# Patient Record
Sex: Female | Born: 1945 | Race: White | Hispanic: No | Marital: Married | Smoking: Never smoker
Health system: Southern US, Community
[De-identification: ages and names within clinical notes are randomized; demographics above are authoritative.]

## PROBLEM LIST (undated history)

## (undated) DIAGNOSIS — E119 Type 2 diabetes mellitus without complications: Secondary | ICD-10-CM

## (undated) DIAGNOSIS — N183 Chronic kidney disease, stage 3 unspecified: Secondary | ICD-10-CM

## (undated) DIAGNOSIS — E7800 Pure hypercholesterolemia, unspecified: Secondary | ICD-10-CM

## (undated) DIAGNOSIS — M1990 Unspecified osteoarthritis, unspecified site: Secondary | ICD-10-CM

## (undated) DIAGNOSIS — I10 Essential (primary) hypertension: Secondary | ICD-10-CM

## (undated) DIAGNOSIS — E1140 Type 2 diabetes mellitus with diabetic neuropathy, unspecified: Secondary | ICD-10-CM

## (undated) HISTORY — PX: CHOLECYSTECTOMY: SHX55

## (undated) HISTORY — PX: BACK SURGERY: SHX140

---

## 2005-01-30 ENCOUNTER — Ambulatory Visit: Payer: Self-pay | Admitting: Internal Medicine

## 2005-06-15 ENCOUNTER — Ambulatory Visit: Payer: Self-pay | Admitting: Unknown Physician Specialty

## 2006-02-04 ENCOUNTER — Ambulatory Visit: Payer: Self-pay | Admitting: Internal Medicine

## 2006-07-12 ENCOUNTER — Ambulatory Visit: Payer: Self-pay | Admitting: Internal Medicine

## 2006-08-10 ENCOUNTER — Ambulatory Visit: Payer: Self-pay | Admitting: Internal Medicine

## 2007-02-09 ENCOUNTER — Ambulatory Visit: Payer: Self-pay | Admitting: Internal Medicine

## 2007-12-29 ENCOUNTER — Ambulatory Visit: Payer: Self-pay | Admitting: Internal Medicine

## 2008-02-13 ENCOUNTER — Ambulatory Visit: Payer: Self-pay | Admitting: Internal Medicine

## 2008-07-03 ENCOUNTER — Ambulatory Visit: Payer: Self-pay | Admitting: Internal Medicine

## 2009-02-19 ENCOUNTER — Ambulatory Visit: Payer: Self-pay | Admitting: Internal Medicine

## 2010-03-04 ENCOUNTER — Ambulatory Visit: Payer: Self-pay | Admitting: Internal Medicine

## 2011-03-06 ENCOUNTER — Ambulatory Visit: Payer: Self-pay | Admitting: Internal Medicine

## 2012-03-07 ENCOUNTER — Ambulatory Visit: Payer: Self-pay | Admitting: Internal Medicine

## 2013-03-08 ENCOUNTER — Ambulatory Visit: Payer: Self-pay | Admitting: Internal Medicine

## 2013-04-20 ENCOUNTER — Ambulatory Visit: Payer: Self-pay | Admitting: Unknown Physician Specialty

## 2013-04-21 LAB — PATHOLOGY REPORT

## 2014-03-13 ENCOUNTER — Ambulatory Visit: Payer: Self-pay | Admitting: Internal Medicine

## 2014-12-05 ENCOUNTER — Ambulatory Visit
Admit: 2014-12-05 | Disposition: A | Discharge status: Home / Self Care | Payer: Self-pay | Attending: Ophthalmology | Admitting: Ophthalmology

## 2014-12-05 LAB — POTASSIUM: Potassium: 4.4 mmol/L

## 2014-12-10 ENCOUNTER — Encounter: Payer: Self-pay | Admitting: *Deleted

## 2014-12-16 NOTE — H&P (Signed)
  History and physical was faxed and scanned in.   

## 2014-12-17 ENCOUNTER — Ambulatory Visit: Payer: Medicare Other | Admitting: Anesthesiology

## 2014-12-17 ENCOUNTER — Encounter: Payer: Self-pay | Admitting: Anesthesiology

## 2014-12-17 ENCOUNTER — Ambulatory Visit
Admission: RE | Admit: 2014-12-17 | Discharge: 2014-12-17 | Disposition: A | Discharge status: Home / Self Care | Payer: Medicare Other | Source: Ambulatory Visit | Attending: Ophthalmology | Admitting: Ophthalmology

## 2014-12-17 ENCOUNTER — Encounter
Admission: RE | Disposition: A | Discharge status: Home / Self Care | Payer: Self-pay | Source: Ambulatory Visit | Attending: Ophthalmology

## 2014-12-17 DIAGNOSIS — E118 Type 2 diabetes mellitus with unspecified complications: Secondary | ICD-10-CM | POA: Diagnosis not present

## 2014-12-17 DIAGNOSIS — I10 Essential (primary) hypertension: Secondary | ICD-10-CM | POA: Insufficient documentation

## 2014-12-17 DIAGNOSIS — H2511 Age-related nuclear cataract, right eye: Secondary | ICD-10-CM | POA: Insufficient documentation

## 2014-12-17 DIAGNOSIS — Z888 Allergy status to other drugs, medicaments and biological substances status: Secondary | ICD-10-CM | POA: Insufficient documentation

## 2014-12-17 DIAGNOSIS — H269 Unspecified cataract: Secondary | ICD-10-CM | POA: Diagnosis present

## 2014-12-17 HISTORY — PX: CATARACT EXTRACTION W/PHACO: SHX586

## 2014-12-17 HISTORY — DX: Essential (primary) hypertension: I10

## 2014-12-17 HISTORY — DX: Unspecified osteoarthritis, unspecified site: M19.90

## 2014-12-17 HISTORY — DX: Type 2 diabetes mellitus with diabetic neuropathy, unspecified: E11.40

## 2014-12-17 LAB — GLUCOSE, CAPILLARY: Glucose-Capillary: 170 mg/dL — ABNORMAL HIGH (ref 70–99)

## 2014-12-17 SURGERY — PHACOEMULSIFICATION, CATARACT, WITH IOL INSERTION
Anesthesia: Monitor Anesthesia Care | Site: Eye | Laterality: Right | Wound class: Clean

## 2014-12-17 MED ORDER — HYALURONIDASE HUMAN 150 UNIT/ML IJ SOLN
INTRAMUSCULAR | Status: AC
  Filled 2014-12-17: qty 1

## 2014-12-17 MED ORDER — MOXIFLOXACIN HCL 0.5 % OP SOLN - NO CHARGE
OPHTHALMIC | Status: DC | PRN
  Administered 2014-12-17: 1 [drp] via OPHTHALMIC

## 2014-12-17 MED ORDER — MOXIFLOXACIN HCL 0.5 % OP SOLN
OPHTHALMIC | Status: AC
  Administered 2014-12-17: 1 [drp] via OPHTHALMIC
  Filled 2014-12-17: qty 3

## 2014-12-17 MED ORDER — CYCLOPENTOLATE HCL 2 % OP SOLN
OPHTHALMIC | Status: AC
  Filled 2014-12-17: qty 2

## 2014-12-17 MED ORDER — EPINEPHRINE HCL 1 MG/ML IJ SOLN
INTRAMUSCULAR | Status: AC
  Filled 2014-12-17: qty 1

## 2014-12-17 MED ORDER — MOXIFLOXACIN HCL 0.5 % OP SOLN
1.0000 [drp] | OPHTHALMIC | Status: AC
Start: 2014-12-17 — End: 2014-12-17
  Administered 2014-12-17 (×3): 1 [drp] via OPHTHALMIC

## 2014-12-17 MED ORDER — TETRACAINE HCL 0.5 % OP SOLN
OPHTHALMIC | Status: DC | PRN
  Administered 2014-12-17: 1 [drp] via OPHTHALMIC

## 2014-12-17 MED ORDER — PHENYLEPHRINE HCL 10 % OP SOLN
OPHTHALMIC | Status: AC
  Filled 2014-12-17: qty 5

## 2014-12-17 MED ORDER — BSS IO SOLN
INTRAOCULAR | Status: DC | PRN
  Administered 2014-12-17: 1 mL

## 2014-12-17 MED ORDER — NA CHONDROIT SULF-NA HYALURON 40-17 MG/ML IO SOLN
INTRAOCULAR | Status: DC | PRN
  Administered 2014-12-17: 1 mL via INTRAOCULAR

## 2014-12-17 MED ORDER — ALFENTANIL 500 MCG/ML IJ INJ
INJECTION | INTRAMUSCULAR | Status: DC | PRN
  Administered 2014-12-17: 500 ug via INTRAVENOUS

## 2014-12-17 MED ORDER — MIDAZOLAM HCL 2 MG/2ML IJ SOLN
INTRAMUSCULAR | Status: DC | PRN
  Administered 2014-12-17 (×2): 0.5 mg via INTRAVENOUS

## 2014-12-17 MED ORDER — LIDOCAINE HCL (PF) 4 % IJ SOLN
INTRAMUSCULAR | Status: AC
  Filled 2014-12-17: qty 5

## 2014-12-17 MED ORDER — PHENYLEPHRINE HCL 10 % OP SOLN
1.0000 [drp] | OPHTHALMIC | Status: AC
  Administered 2014-12-17 (×2): 1 [drp] via OPHTHALMIC
  Administered 2014-12-17: 09:00:00 via OPHTHALMIC
  Administered 2014-12-17: 1 [drp] via OPHTHALMIC

## 2014-12-17 MED ORDER — CEFUROXIME OPHTHALMIC INJECTION 1 MG/0.1 ML
INJECTION | OPHTHALMIC | Status: AC
  Filled 2014-12-17: qty 0.1

## 2014-12-17 MED ORDER — BUPIVACAINE HCL (PF) 0.75 % IJ SOLN
INTRAMUSCULAR | Status: AC
  Filled 2014-12-17: qty 10

## 2014-12-17 MED ORDER — NA CHONDROIT SULF-NA HYALURON 40-17 MG/ML IO SOLN
INTRAOCULAR | Status: AC
  Filled 2014-12-17: qty 1

## 2014-12-17 MED ORDER — SODIUM CHLORIDE 0.9 % IV SOLN
INTRAVENOUS | Status: DC
  Administered 2014-12-17: 09:00:00 via INTRAVENOUS

## 2014-12-17 MED ORDER — TETRACAINE HCL 0.5 % OP SOLN
OPHTHALMIC | Status: AC
  Filled 2014-12-17: qty 2

## 2014-12-17 MED ORDER — CYCLOPENTOLATE HCL 2 % OP SOLN
1.0000 [drp] | OPHTHALMIC | Status: AC
  Administered 2014-12-17: 1 [drp] via OPHTHALMIC
  Administered 2014-12-17: 08:00:00 via OPHTHALMIC
  Administered 2014-12-17 (×2): 1 [drp] via OPHTHALMIC

## 2014-12-17 MED ORDER — CARBACHOL 0.01 % IO SOLN
INTRAOCULAR | Status: DC | PRN
  Administered 2014-12-17: 0.5 mL via INTRAOCULAR

## 2014-12-17 SURGICAL SUPPLY — 27 items
CENTURION VISION SYSTEM ×2 IMPLANT
CORD BIP STRL DISP 12FT (MISCELLANEOUS) ×2 IMPLANT
DRAPE XRAY CASSETTE 23X24 (DRAPES) ×2 IMPLANT
ERASER HMR WETFIELD 18G (MISCELLANEOUS) ×2 IMPLANT
GLOVE BIO SURGEON STRL SZ8 (GLOVE) ×2 IMPLANT
GLOVE SURG LX 6.5 MICRO (GLOVE) ×1
GLOVE SURG LX 8.0 MICRO (GLOVE) ×1
GLOVE SURG LX STRL 6.5 MICRO (GLOVE) ×1 IMPLANT
GLOVE SURG LX STRL 8.0 MICRO (GLOVE) ×1 IMPLANT
GOWN STRL REUS W/ TWL LRG LVL3 (GOWN DISPOSABLE) ×1 IMPLANT
GOWN STRL REUS W/ TWL XL LVL3 (GOWN DISPOSABLE) ×1 IMPLANT
GOWN STRL REUS W/TWL LRG LVL3 (GOWN DISPOSABLE) ×1
GOWN STRL REUS W/TWL XL LVL3 (GOWN DISPOSABLE) ×1
LENS IOL ACRSF IQ ULTRA 16.0 (Intraocular Lens) ×1 IMPLANT
LENS IOL ACRYSERT 16.0 (Intraocular Lens) ×2 IMPLANT
LENS IOL ACRYSOF IQ 16.0 (Intraocular Lens) ×2 IMPLANT
PACK CATARACT (MISCELLANEOUS) ×2 IMPLANT
PACK CATARACT DINGLEDEIN LX (MISCELLANEOUS) ×2 IMPLANT
PACK EYE AFTER SURG (MISCELLANEOUS) ×2 IMPLANT
SHLD EYE VISITEC  UNIV (MISCELLANEOUS) ×2 IMPLANT
SOL PREP PVP 2OZ (MISCELLANEOUS) ×2
SOLUTION PREP PVP 2OZ (MISCELLANEOUS) ×1 IMPLANT
SUT SILK 5-0 (SUTURE) ×2 IMPLANT
SYR 5ML LL (SYRINGE) ×2 IMPLANT
SYR TB 1ML 27GX1/2 LL (SYRINGE) ×2 IMPLANT
WATER STERILE IRR 1000ML POUR (IV SOLUTION) ×2 IMPLANT
WIPE NON LINTING 3.25X3.25 (MISCELLANEOUS) ×2 IMPLANT

## 2014-12-17 NOTE — Discharge Instructions (Addendum)
Appointment Time 10:25   12/18/14  See handout. Eye Surgery Discharge Instructions  Expect mild scratchy sensation or mild soreness. DO NOT RUB YOUR EYE!  The day of surgery:  Minimal physical activity, but bed rest is not required  No reading, computer work, or close hand work  No bending, lifting, or straining.  May watch TV  For 24 hours:  No driving, legal decisions, or alcoholic beverages  Safety precautions  Eat anything you prefer: It is better to start with liquids, then soup then solid foods.  _____ Eye patch should be worn until postoperative exam tomorrow.  ____ Solar shield eyeglasses should be worn for comfort in the sunlight/patch while sleeping  Resume all regular medications including aspirin or Coumadin if these were discontinued prior to surgery. You may shower, bathe, shave, or wash your hair. Tylenol may be taken for mild discomfort.  Call your doctor if you experience significant pain, nausea, or vomiting, fever > 101 or other signs of infection. [phone] or [phone] Specific instructions:  Follow-up Information    Follow up with DINGELDEIN,STEVEN, MD In 1 day.   Specialty:  Ophthalmology   Contact information:   [address]   [place] [id number] [phone]

## 2014-12-17 NOTE — Transfer of Care (Signed)
Immediate Anesthesia Transfer of Care Note  Patient: Kara Wood  Procedure(s) Performed: Procedure(s) with comments: CATARACT EXTRACTION PHACO AND INTRAOCULAR LENS PLACEMENT (IOC) (Right) - US 01:17 AP% 26.0 CDE 33.35  Patient Location: PACU  Anesthesia Type:MAC  Level of Consciousness: awake, alert  and oriented  Airway & Oxygen Therapy: Patient Spontanous Breathing  Post-op Assessment: Report given to RN and Post -op Vital signs reviewed and stable  Post vital signs: Reviewed and stable  Last Vitals:  Filed Vitals:   12/17/14 0800  BP: 141/56  Pulse: 60  Temp: 37.2 C  Resp: 20    Complications: No apparent anesthesia complications

## 2014-12-17 NOTE — Interval H&P Note (Signed)
History and Physical Interval Note:  12/17/2014 10:20 AM  Kara Wood  has presented today for surgery, with the diagnosis of cataract  The various methods of treatment have been discussed with the patient and family. After consideration of risks, benefits and other options for treatment, the patient has consented to  Procedure(s): CATARACT EXTRACTION PHACO AND INTRAOCULAR LENS PLACEMENT (IOC) (Right) as a surgical intervention .  The patient's history has been reviewed, patient examined, no change in status, stable for surgery.  I have reviewed the patient's chart and labs.  Questions were answered to the patient's satisfaction.     Acy Orsak

## 2014-12-17 NOTE — Anesthesia Preprocedure Evaluation (Signed)
Anesthesia Evaluation   Patient awake    Reviewed: Allergy & Precautions, H&P , NPO status , Patient's Chart, lab work & pertinent test results  Airway Mallampati: II  TM Distance: >3 FB Neck ROM: full    Dental  (+) Chipped   Pulmonary          Cardiovascular hypertension,     Neuro/Psych    GI/Hepatic   Endo/Other  diabetes, Well Controlled, Type 2  Renal/GU      Musculoskeletal   Abdominal   Peds  Hematology   Anesthesia Other Findings   Reproductive/Obstetrics                             Anesthesia Physical Anesthesia Plan  ASA: III  Anesthesia Plan: MAC   Post-op Pain Management:    Induction:   Airway Management Planned: Nasal Cannula  Additional Equipment:   Intra-op Plan:   Post-operative Plan:   Informed Consent: I have reviewed the patients History and Physical, chart, labs and discussed the procedure including the risks, benefits and alternatives for the proposed anesthesia with the patient or authorized representative who has indicated his/her understanding and acceptance.     Plan Discussed with: CRNA  Anesthesia Plan Comments:         Anesthesia Quick Evaluation

## 2014-12-17 NOTE — Anesthesia Postprocedure Evaluation (Signed)
  Anesthesia Post-op Note  Patient: Kara Wood  Procedure(s) Performed: Procedure(s) with comments: CATARACT EXTRACTION PHACO AND INTRAOCULAR LENS PLACEMENT (IOC) (Right) - US 01:17 AP% 26.0 CDE 33.35  Anesthesia type:MAC  Patient location: PACU  Post pain: Pain level controlled  Post assessment: Post-op Vital signs reviewed, Patient's Cardiovascular Status Stable, Respiratory Function Stable, Patent Airway and No signs of Nausea or vomiting  Post vital signs: Reviewed and stable  Last Vitals:  Filed Vitals:   12/17/14 0800  BP: 141/56  Pulse: 60  Temp: 37.2 C  Resp: 20    Level of consciousness: awake, alert  and patient cooperative  Complications: No apparent anesthesia complications

## 2014-12-17 NOTE — Op Note (Signed)
Date of Surgery: 12/17/2014 Date of Dictation: 12/17/2014 Pre-operative Diagnosis:Nuclear Sclerotic Cataract, Posterior Subcapsular Cataract, Cortical Cataract and Mature Cataract right Eye Post-operative Diagnosis: same Procedure performed: Extra-capsular Cataract Extraction (ECCE) with placement of a posterior chamber intraocular lens (IOL) right Eye IOL: SN^CWS 16.0D  Anesthesia: 2% Lidocaine and 4% Marcaine in a 50/50 mixture with 10 unites/ml of Hylenex given as a peribulbar Anesthesiologist: Dr. Maisie Fushomas Complications: none Estimated Blood Loss: less than 1 ml  Description of procedure:  The patient was given anesthesia and sedation via intravenous access. The patient was then prepped and draped in the usual fashion. A 25-gauge needle was bent for initiating the capsulorhexis. A 5-0 silk suture was placed through the conjunctiva superior and inferiorly to serve as bridle sutures. Hemostasis was obtained at the superior limbus using an eraser cautery. A partial thickness groove was made at the anterior surgical limbus with a 64 Beaver blade and this was dissected anteriorly with an SYSCOlcon Crescent knife. The anterior chamber was entered at 10 o'clock with a 1.0 mm paracentesis knife and through the lamellar dissection with a 2.6 mm Alcon keratome. DiscoVisc was injected to replace the aqueous and a continuous tear curvilinear capsulorhexis was performed using a bent 25-gauge needle.  Balance salt on a syringe was used to perform hydro-dissection and phacoemulsification was carried out using a divide and conquer technique. Total ultrasound time was 1:17. The average ultrasonic power was 26.0. The CDE was 33.35.  Irrigation/aspiration was used to remove the residual cortex and the capsular bag was inflated with DiscoVisc. The intraocular lens was inserted into the capsular bag using a pre-loaded Acrysert Delivery System. Irrigation/aspiration was used to remove the residual DiscoVisc. The wound was  inflated with balanced salt and checked for leaks. None were found. Miostat was injected via the paracentesis track and 0.1 ml of cefuroxime containing 1 mg of drug  was injected via the paracentesis track. The wound was checked for leaks again and none were found.   The bridal sutures were removed and two drops of Vigamox were placed on the eye. An eye shield was placed to protect the eye and the patient was discharged to the recovery area in good condition.   Kissy Cielo MD

## 2014-12-20 ENCOUNTER — Encounter: Payer: Self-pay | Admitting: Ophthalmology

## 2015-01-15 ENCOUNTER — Other Ambulatory Visit: Payer: Self-pay | Admitting: Internal Medicine

## 2015-01-15 DIAGNOSIS — Z1231 Encounter for screening mammogram for malignant neoplasm of breast: Secondary | ICD-10-CM

## 2015-03-15 ENCOUNTER — Other Ambulatory Visit: Payer: Self-pay | Admitting: Internal Medicine

## 2015-03-15 ENCOUNTER — Ambulatory Visit
Admission: RE | Admit: 2015-03-15 | Discharge: 2015-03-15 | Disposition: A | Discharge status: Home / Self Care | Payer: Medicare Other | Source: Ambulatory Visit | Attending: Internal Medicine | Admitting: Internal Medicine

## 2015-03-15 DIAGNOSIS — Z1231 Encounter for screening mammogram for malignant neoplasm of breast: Secondary | ICD-10-CM

## 2016-01-23 ENCOUNTER — Other Ambulatory Visit: Payer: Self-pay | Admitting: Internal Medicine

## 2016-01-23 DIAGNOSIS — Z1231 Encounter for screening mammogram for malignant neoplasm of breast: Secondary | ICD-10-CM

## 2016-03-18 ENCOUNTER — Ambulatory Visit
Admission: RE | Admit: 2016-03-18 | Discharge: 2016-03-18 | Disposition: A | Discharge status: Home / Self Care | Payer: Medicare Other | Source: Ambulatory Visit | Attending: Internal Medicine | Admitting: Internal Medicine

## 2016-03-18 ENCOUNTER — Other Ambulatory Visit: Payer: Self-pay | Admitting: Internal Medicine

## 2016-03-18 DIAGNOSIS — Z1231 Encounter for screening mammogram for malignant neoplasm of breast: Secondary | ICD-10-CM | POA: Insufficient documentation

## 2016-04-28 IMAGING — MG MM DIGITAL SCREENING BILAT W/ TOMO W/ CAD
8 of 12 series · 8 of 28 positions shown · non-contrast
Comparison: Previous exam(s).

CLINICAL DATA: Screening.

EXAM:
DIGITAL SCREENING BILATERAL MAMMOGRAM WITH 3D TOMO WITH CAD

[R CC synth-2D]
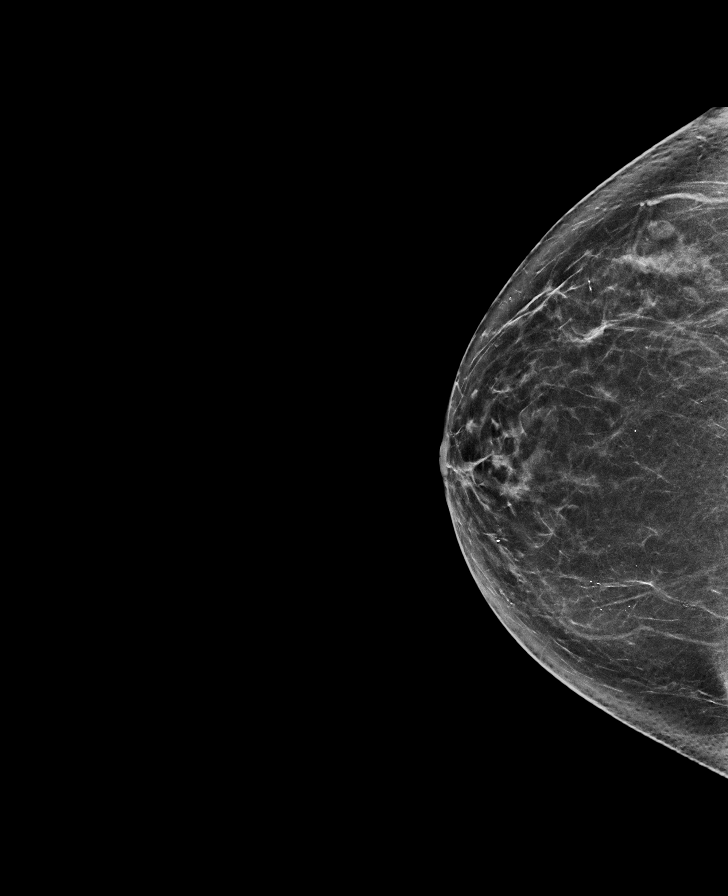

[R CC]
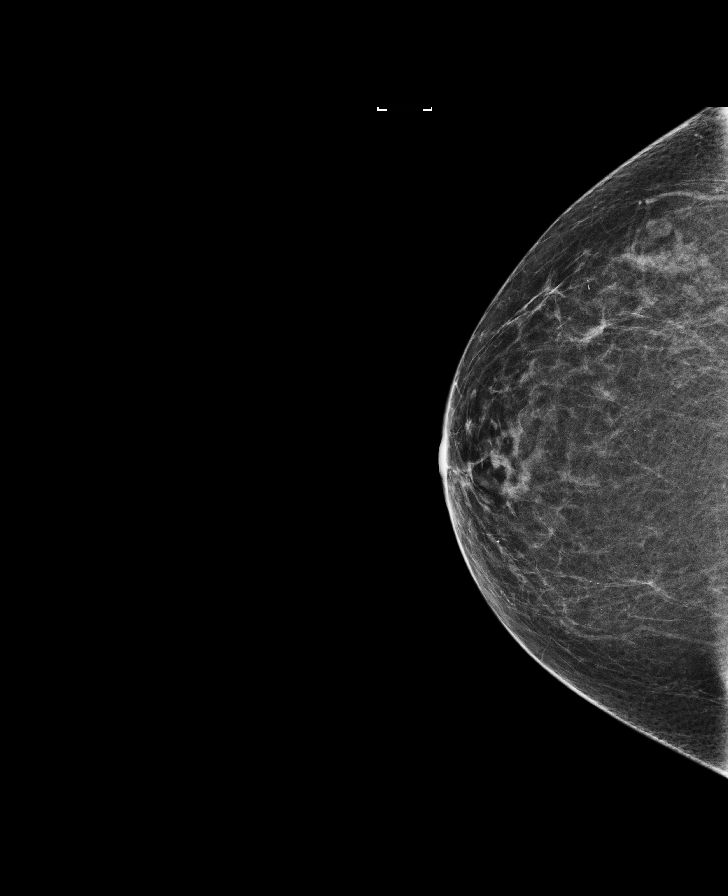

[R MLO synth-2D]
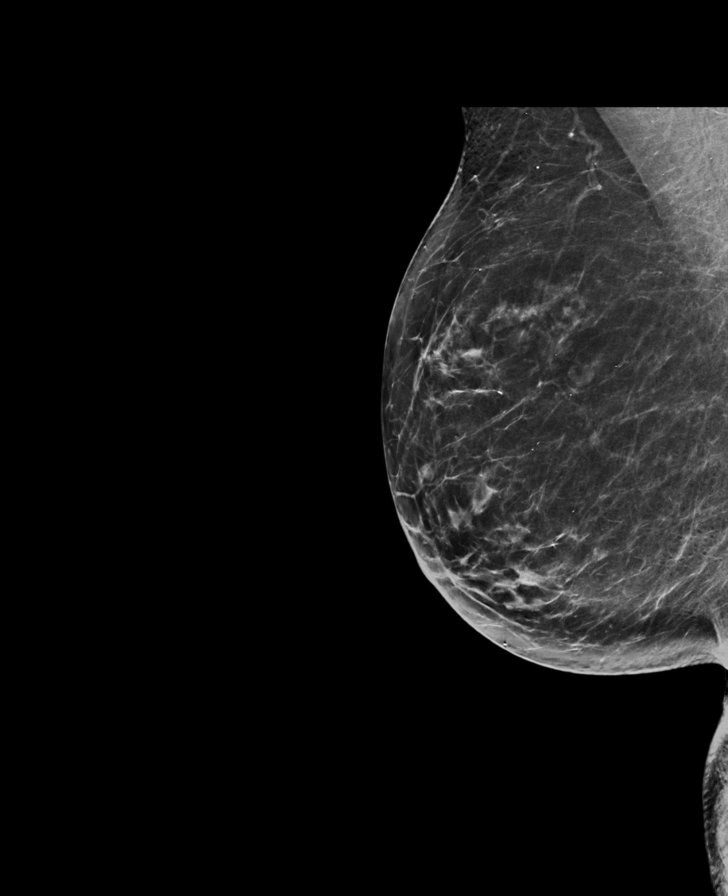

[L MLO synth-2D]
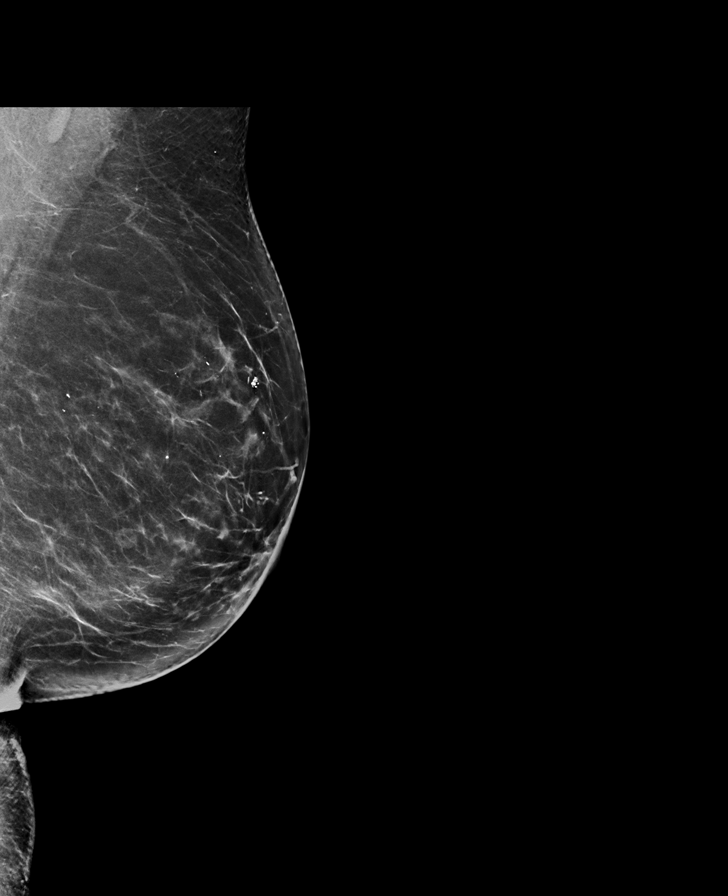

[L CC]
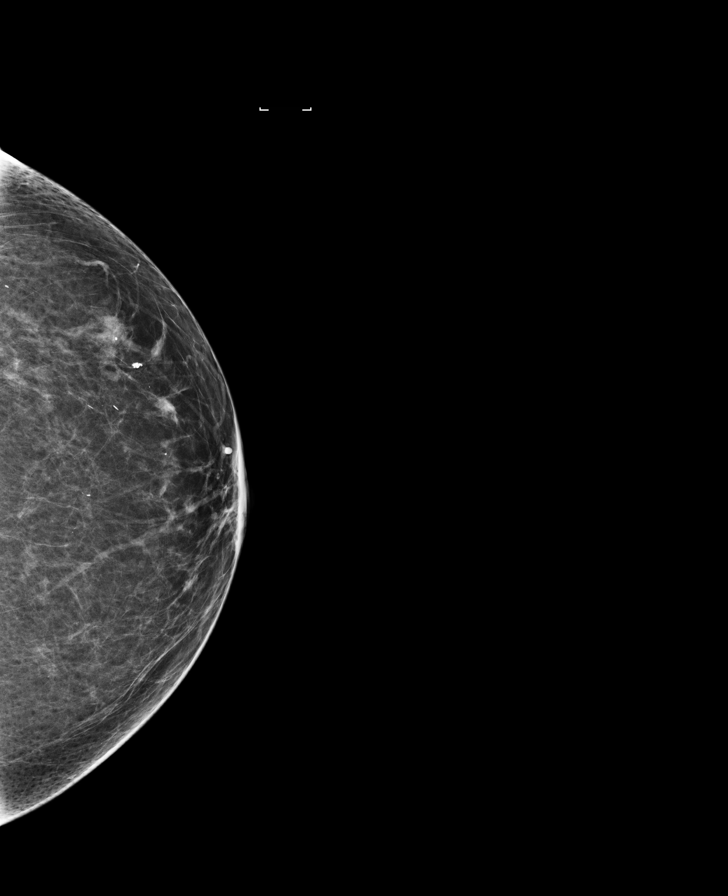

[R MLO]
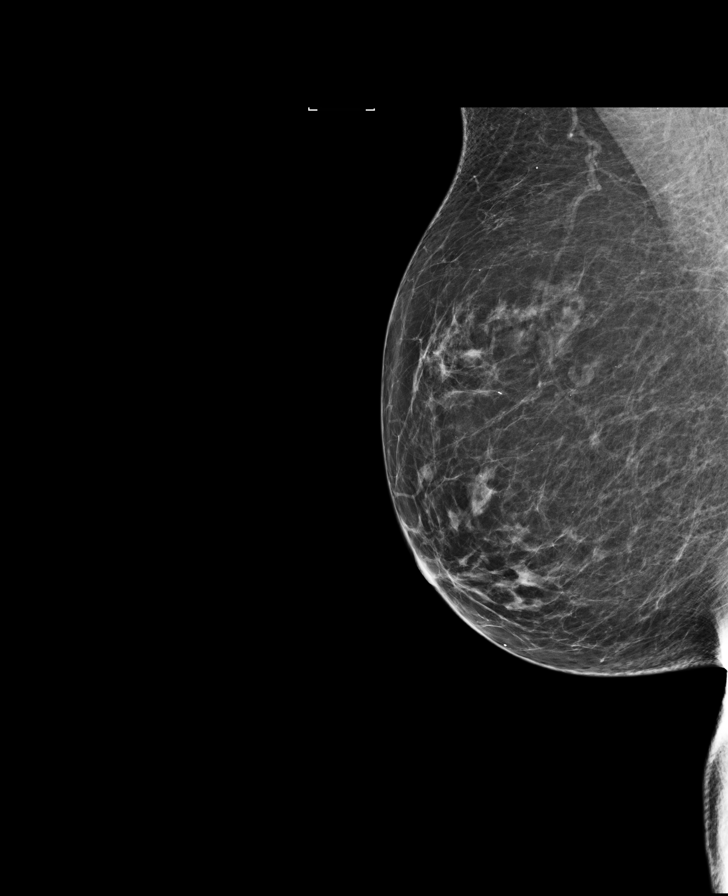

[L MLO]
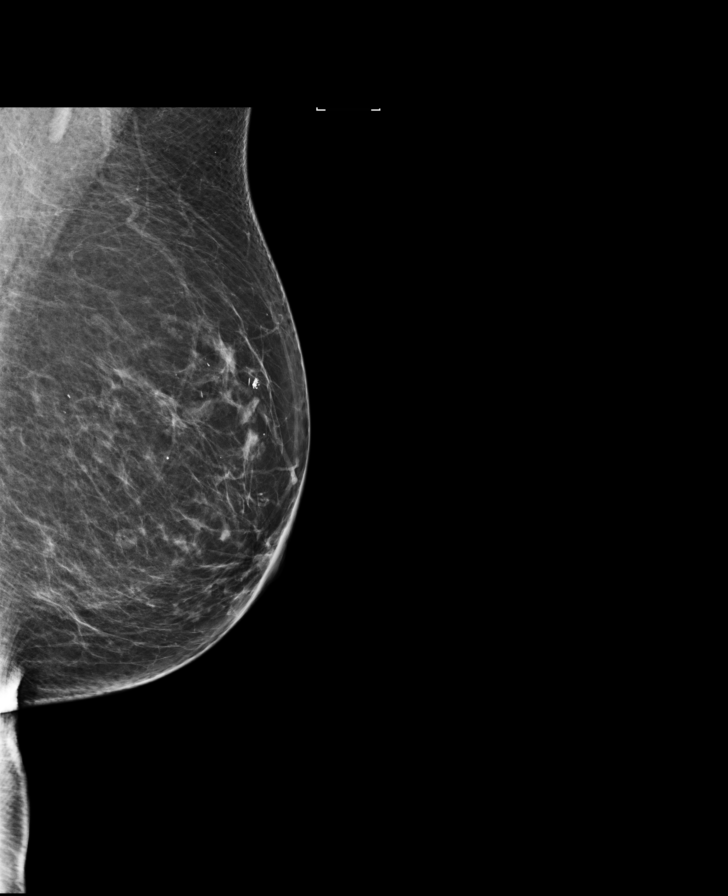

[L CC synth-2D]
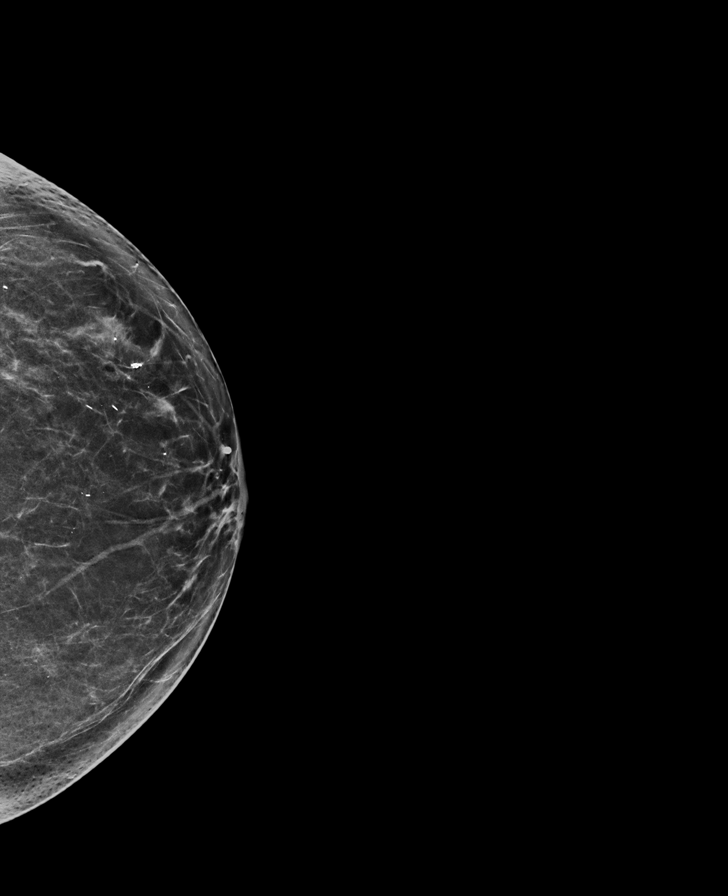

[8 of 28 positions shown; findings below may reference images not displayed]

ACR Breast Density Category b: There are scattered areas of
fibroglandular density.
FINDINGS: There are no findings suspicious for malignancy. Images were
processed with CAD.
IMPRESSION: No mammographic evidence of malignancy. A result letter of this
screening mammogram will be mailed directly to the patient.

RECOMMENDATION:
Screening mammogram in one year. (Code:55-L-23V)

BI-RADS CATEGORY  1: Negative.

## 2017-01-20 ENCOUNTER — Other Ambulatory Visit: Payer: Self-pay | Admitting: Internal Medicine

## 2017-01-20 DIAGNOSIS — Z1231 Encounter for screening mammogram for malignant neoplasm of breast: Secondary | ICD-10-CM

## 2017-03-23 ENCOUNTER — Ambulatory Visit
Admission: RE | Admit: 2017-03-23 | Discharge: 2017-03-23 | Disposition: A | Discharge status: Home / Self Care | Payer: Medicare Other | Source: Ambulatory Visit | Attending: Internal Medicine | Admitting: Internal Medicine

## 2017-03-23 DIAGNOSIS — Z1231 Encounter for screening mammogram for malignant neoplasm of breast: Secondary | ICD-10-CM | POA: Insufficient documentation

## 2018-02-21 ENCOUNTER — Other Ambulatory Visit: Payer: Self-pay | Admitting: Internal Medicine

## 2018-02-21 DIAGNOSIS — Z1231 Encounter for screening mammogram for malignant neoplasm of breast: Secondary | ICD-10-CM

## 2018-03-24 ENCOUNTER — Ambulatory Visit
Admission: RE | Admit: 2018-03-24 | Discharge: 2018-03-24 | Disposition: A | Discharge status: Home / Self Care | Payer: Medicare Other | Source: Ambulatory Visit | Attending: Internal Medicine | Admitting: Internal Medicine

## 2018-03-24 DIAGNOSIS — Z1231 Encounter for screening mammogram for malignant neoplasm of breast: Secondary | ICD-10-CM | POA: Diagnosis not present

## 2019-05-03 ENCOUNTER — Other Ambulatory Visit: Payer: Self-pay | Admitting: Internal Medicine

## 2019-05-03 DIAGNOSIS — Z1231 Encounter for screening mammogram for malignant neoplasm of breast: Secondary | ICD-10-CM

## 2019-06-07 ENCOUNTER — Ambulatory Visit
Admission: RE | Admit: 2019-06-07 | Discharge: 2019-06-07 | Disposition: A | Discharge status: Home / Self Care | Payer: Medicare Other | Source: Ambulatory Visit | Attending: Internal Medicine | Admitting: Internal Medicine

## 2019-06-07 DIAGNOSIS — Z1231 Encounter for screening mammogram for malignant neoplasm of breast: Secondary | ICD-10-CM | POA: Diagnosis present

## 2019-06-12 ENCOUNTER — Other Ambulatory Visit: Payer: Self-pay

## 2019-06-12 DIAGNOSIS — Z20822 Contact with and (suspected) exposure to covid-19: Secondary | ICD-10-CM

## 2019-06-14 LAB — NOVEL CORONAVIRUS, NAA: SARS-CoV-2, NAA: NOT DETECTED

## 2019-07-05 ENCOUNTER — Other Ambulatory Visit: Payer: Self-pay

## 2019-07-05 DIAGNOSIS — Z20822 Contact with and (suspected) exposure to covid-19: Secondary | ICD-10-CM

## 2019-07-07 ENCOUNTER — Telehealth: Payer: Self-pay | Admitting: *Deleted

## 2019-07-07 LAB — NOVEL CORONAVIRUS, NAA: SARS-CoV-2, NAA: DETECTED — AB

## 2019-07-07 NOTE — Telephone Encounter (Signed)
Patient called for results ,still pending advised to call back. 

## 2019-07-09 ENCOUNTER — Telehealth: Payer: Self-pay | Admitting: Unknown Physician Specialty

## 2019-07-09 NOTE — Telephone Encounter (Signed)
Discussed with patient about Covid symptoms and the use of bamlanivimab, a monoclonal antibody infusion for those with mild to moderate Covid symptoms and at a high risk of hospitalization.    Pt is not qualified due top symptoms

## 2020-05-22 ENCOUNTER — Other Ambulatory Visit: Payer: Self-pay | Admitting: Internal Medicine

## 2020-05-22 DIAGNOSIS — Z1231 Encounter for screening mammogram for malignant neoplasm of breast: Secondary | ICD-10-CM

## 2020-06-25 ENCOUNTER — Ambulatory Visit
Admission: RE | Admit: 2020-06-25 | Discharge: 2020-06-25 | Disposition: A | Discharge status: Home / Self Care | Payer: Medicare Other | Source: Ambulatory Visit | Attending: Internal Medicine | Admitting: Internal Medicine

## 2020-06-25 ENCOUNTER — Other Ambulatory Visit: Payer: Self-pay

## 2020-06-25 DIAGNOSIS — Z1231 Encounter for screening mammogram for malignant neoplasm of breast: Secondary | ICD-10-CM | POA: Diagnosis not present

## 2021-08-13 ENCOUNTER — Other Ambulatory Visit: Payer: Self-pay | Admitting: Internal Medicine

## 2021-08-13 DIAGNOSIS — Z1231 Encounter for screening mammogram for malignant neoplasm of breast: Secondary | ICD-10-CM

## 2021-08-18 ENCOUNTER — Ambulatory Visit
Admission: RE | Admit: 2021-08-18 | Discharge: 2021-08-18 | Disposition: A | Discharge status: Home / Self Care | Payer: Medicare Other | Source: Ambulatory Visit | Attending: Internal Medicine | Admitting: Internal Medicine

## 2021-08-18 ENCOUNTER — Other Ambulatory Visit: Payer: Self-pay

## 2021-08-18 DIAGNOSIS — Z1231 Encounter for screening mammogram for malignant neoplasm of breast: Secondary | ICD-10-CM | POA: Diagnosis not present

## 2021-08-31 ENCOUNTER — Ambulatory Visit
Admission: EM | Admit: 2021-08-31 | Discharge: 2021-08-31 | Disposition: A | Discharge status: Home / Self Care | Payer: Medicare Other

## 2021-08-31 ENCOUNTER — Other Ambulatory Visit: Payer: Self-pay

## 2021-08-31 DIAGNOSIS — J22 Unspecified acute lower respiratory infection: Secondary | ICD-10-CM | POA: Diagnosis not present

## 2021-08-31 HISTORY — DX: Pure hypercholesterolemia, unspecified: E78.00

## 2021-08-31 HISTORY — DX: Chronic kidney disease, stage 3 unspecified: N18.30

## 2021-08-31 HISTORY — DX: Type 2 diabetes mellitus without complications: E11.9

## 2021-08-31 MED ORDER — DOXYCYCLINE HYCLATE 100 MG PO CAPS: 100.0000 mg | ORAL_CAPSULE | Freq: Two times a day (BID) | ORAL | 0 refills | Status: AC

## 2021-08-31 MED ORDER — BENZONATATE 100 MG PO CAPS
200.0000 mg | ORAL_CAPSULE | Freq: Three times a day (TID) | ORAL | 0 refills | Status: AC | PRN
Start: 2021-08-31 — End: ?

## 2021-08-31 NOTE — ED Provider Notes (Signed)
Roderic Palau    CSN: [id number] Arrival date & time: 08/31/21  1513      History   Chief Complaint Chief Complaint  Patient presents with   Cough   Nasal Congestion    HPI Kara Wood is a 76 y.o. female.   HPI Patient presents today for evaluation of cough, congestion and initially sinus congestion. Patient reports managing symptoms with otc cough and cold medication without relief of symptoms. She has not have measurable fever. Symptoms have been intermittent since November although she has continued to test for COVID all have remained negative. She endorses that her cough is mostly nonproductive. No history of asthma or chronic bronchitis.  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   Past Surgical History:  Procedure Laterality Date   BACK SURGERY     CATARACT EXTRACTION W/PHACO Right 12/17/2014   Procedure: CATARACT EXTRACTION PHACO AND INTRAOCULAR LENS PLACEMENT (IOC);  Surgeon: Estill Cotta, MD;  Location: [facility] ORS;  Service: Ophthalmology;  Laterality: Right;  [place] 01:17 AP% 26.0 CDE 33.35   CHOLECYSTECTOMY      OB History   No obstetric history on file.      Home Medications    Prior to Admission medications   Medication Sig Start Date End Date Taking? Authorizing Provider  benzonatate (TESSALON) 100 MG capsule Take 2 capsules (200 mg total) by mouth 3 (three) times daily as needed for cough. 08/31/21  Yes Scot Jun, FNP  doxycycline (VIBRAMYCIN) 100 MG capsule Take 1 capsule (100 mg total) by mouth 2 (two) times daily. 08/31/21  Yes Scot Jun, FNP  Absorbable Collagen Hemostat (ACTIFOAM COLLAGEN SPONGE) MISC Use 1,000 mg once daily    [provider]  Biotin 5000 MCG SUBL Place under the tongue.    [provider]   cetirizine (ZYRTEC) 10 MG tablet Take 10 mg by mouth daily.    [provider]  glipiZIDE (GLUCOTROL) 10 MG tablet Take 10 mg by mouth daily before breakfast.    [provider]  isosorbide dinitrate (ISORDIL) 30 MG tablet Take 30 mg by mouth daily.    [provider]  LANTUS SOLOSTAR 100 UNIT/ML Solostar Pen Inject into the skin. 05/22/21   [provider]  loperamide (IMODIUM) 2 MG capsule Take by mouth.    [provider]  loratadine (CLARITIN) 10 MG tablet Take by mouth.    [provider]  meclizine (ANTIVERT) 25 MG tablet Take by mouth.    [provider]  meloxicam (MOBIC) 7.5 MG tablet Take 7.5 mg by mouth daily.    [provider]  metFORMIN (GLUCOPHAGE-XR) 500 MG 24 hr tablet Take 1,000 mg by mouth 2 (two) times daily. 07/23/21   [provider]  metFORMIN (GLUMETZA) 1000 MG (MOD) 24 hr tablet Take 1,000 mg by mouth 2 (two) times daily with a meal.    [provider]  nebivolol (BYSTOLIC) 10 MG tablet Take 10 mg by mouth daily.    [provider]  Omeprazole-Sodium Bicarbonate (ZEGERID) 20-1100 MG CAPS capsule Take 1 capsule by mouth 2 (two) times daily.    [provider]  pioglitazone (ACTOS) 15 MG tablet Take 15 mg by mouth daily.  [provider]  rosuvastatin (CRESTOR) 10 MG tablet Take 10 mg by mouth daily.    [provider]  valsartan (DIOVAN) 320 MG tablet Take 320 mg by mouth daily. 06/12/21   [provider]  valsartan-hydrochlorothiazide (DIOVAN-HCT) 160-12.5 MG per tablet Take 1 tablet by mouth daily.    [provider]  Vitamin E (VITAMIN E/D-ALPHA NATURAL) 268 MG (400 UNIT) CAPS Take by mouth.    [provider]    Family History Family History  Problem Relation Age of Onset   Breast cancer Maternal Aunt        72's    Social History Social History   Tobacco Use   Smoking status: Never   Smokeless tobacco:  Never  Vaping Use   Vaping Use: Never used  Substance Use Topics   Alcohol use: No   Drug use: Never     Allergies   Nsaids, Ozempic (0.25 or 0.5 mg-dose) [semaglutide(0.25 or 0.5mg -dos)], Trulicity [dulaglutide], and Tolectin [tolmetin]   Review of Systems Review of Systems Pertinent negatives listed in HPI  Physical Exam Triage Vital Signs ED Triage Vitals  Enc Vitals Group     BP 08/31/21 1526 (!) 162/68     Pulse Rate 08/31/21 1526 71     Resp 08/31/21 1526 18     Temp 08/31/21 1526 98.3 F (36.8 C)     Temp Source 08/31/21 1526 Oral     SpO2 08/31/21 1526 95 %     Weight --      Height --      Head Circumference --      Peak Flow --      Pain Score 08/31/21 1533 0     Pain Loc --      Pain Edu? --      Excl. in GC? --    No data found.  Updated Vital Signs BP (!) 162/68 (BP Location: Left Arm)    Pulse 71    Temp 98.3 F (36.8 C) (Oral)    Resp 18    SpO2 95%   Visual Acuity Right Eye Distance:   Left Eye Distance:   Bilateral Distance:    Right Eye Near:   Left Eye Near:    Bilateral Near:     Physical Exam Constitutional:      Appearance: Normal appearance.  HENT:     Head: Normocephalic and atraumatic.     Right Ear: Tympanic membrane, ear canal and external ear normal. There is no impacted cerumen.     Left Ear: Tympanic membrane, ear canal and external ear normal. There is no impacted cerumen.     Nose: Congestion and rhinorrhea present.     Mouth/Throat:     Pharynx: No oropharyngeal exudate or posterior oropharyngeal erythema.  Eyes:     Extraocular Movements: Extraocular movements intact.     Pupils: Pupils are equal, round, and reactive to light.  Cardiovascular:     Rate and Rhythm: Normal rate and regular rhythm.  Pulmonary:     Breath sounds: No transmitted upper airway sounds. Examination of the right-upper field reveals rhonchi. Examination of the left-upper field reveals rhonchi. Rhonchi present. No decreased breath sounds or  wheezing.  Skin:    General: Skin is warm.     Capillary Refill: Capillary refill takes less than 2 seconds.  Neurological:     Mental Status: She is alert.  Psychiatric:        Attention and Perception: Attention normal.  Mood and Affect: Mood normal.        Speech: Speech normal.        Behavior: Behavior normal.        Cognition and Memory: Cognition normal.        Judgment: Judgment normal.     UC Treatments / Results  Labs (all labs ordered are listed, but only abnormal results are displayed) Labs Reviewed - No data to display  EKG   Radiology No results found.  Procedures Procedures (including critical care time)  Medications Ordered in UC Medications - No data to display  Initial Impression / Assessment and Plan / UC Course  I have reviewed the triage vital signs and the nursing notes.  Pertinent labs & imaging results that were available during my care of the patient were reviewed by me and considered in my medical decision making (see chart for details).    Acute lower respiratory infection  Treating with doxycycline 100 mg twice daily for total of 10 days.  Patient suffers her diabetes therefore will like to avoid treating with any oral prednisone.  For cough Tessalon Perles 1 to 2 tablets as needed for cough.  Strict follow-up precautions given if symptoms do not readily improve.  Red flag precautions discussed.  Return to clinic as needed Final Clinical Impressions(s) / UC Diagnoses   Final diagnoses:  Acute lower respiratory infection     Discharge Instructions      If your symptoms do not readily improve,after completing treatment return for evaluation or follow-up with PCP. If your symptoms become severe, go to the emergency department.     ED Prescriptions     Medication Sig Dispense Auth. Provider   doxycycline (VIBRAMYCIN) 100 MG capsule Take 1 capsule (100 mg total) by mouth 2 (two) times daily. 20 capsule Scot Jun, FNP    benzonatate (TESSALON) 100 MG capsule Take 2 capsules (200 mg total) by mouth 3 (three) times daily as needed for cough. 30 capsule Scot Jun, FNP      PDMP not reviewed this encounter.   Scot Jun, FNP 08/31/21 1556

## 2021-08-31 NOTE — Discharge Instructions (Signed)
If your symptoms do not readily improve,after completing treatment return for evaluation or follow-up with PCP. If your symptoms become severe, go to the emergency department.

## 2021-08-31 NOTE — ED Triage Notes (Signed)
Patient presents to Urgent Care with complaints of nasal congestion and cough since on-going issue since middle of November. Negative for covid. Treating symptoms with mucinex and myquil.   Denies fever.

## 2021-09-18 ENCOUNTER — Other Ambulatory Visit (HOSPITAL_COMMUNITY): Payer: Self-pay | Admitting: Nephrology

## 2021-09-24 ENCOUNTER — Other Ambulatory Visit: Payer: Self-pay | Admitting: Nephrology

## 2021-09-24 DIAGNOSIS — E1122 Type 2 diabetes mellitus with diabetic chronic kidney disease: Secondary | ICD-10-CM

## 2021-09-24 DIAGNOSIS — N1831 Chronic kidney disease, stage 3a: Secondary | ICD-10-CM

## 2021-09-24 DIAGNOSIS — R809 Proteinuria, unspecified: Secondary | ICD-10-CM

## 2021-09-24 DIAGNOSIS — D631 Anemia in chronic kidney disease: Secondary | ICD-10-CM

## 2021-09-24 DIAGNOSIS — E785 Hyperlipidemia, unspecified: Secondary | ICD-10-CM

## 2021-09-24 DIAGNOSIS — N189 Chronic kidney disease, unspecified: Secondary | ICD-10-CM

## 2021-10-01 ENCOUNTER — Other Ambulatory Visit: Payer: Self-pay

## 2021-10-01 ENCOUNTER — Ambulatory Visit
Admission: RE | Admit: 2021-10-01 | Discharge: 2021-10-01 | Disposition: A | Discharge status: Home / Self Care | Payer: Medicare Other | Source: Ambulatory Visit | Attending: Nephrology | Admitting: Nephrology

## 2021-10-01 DIAGNOSIS — E1122 Type 2 diabetes mellitus with diabetic chronic kidney disease: Secondary | ICD-10-CM | POA: Insufficient documentation

## 2021-10-01 DIAGNOSIS — N1831 Chronic kidney disease, stage 3a: Secondary | ICD-10-CM | POA: Insufficient documentation

## 2021-10-01 DIAGNOSIS — N189 Chronic kidney disease, unspecified: Secondary | ICD-10-CM | POA: Insufficient documentation

## 2021-10-01 DIAGNOSIS — E785 Hyperlipidemia, unspecified: Secondary | ICD-10-CM | POA: Diagnosis present

## 2021-10-01 DIAGNOSIS — R809 Proteinuria, unspecified: Secondary | ICD-10-CM | POA: Diagnosis present

## 2021-10-01 DIAGNOSIS — D631 Anemia in chronic kidney disease: Secondary | ICD-10-CM | POA: Diagnosis present

## 2022-08-18 ENCOUNTER — Other Ambulatory Visit: Payer: Self-pay | Admitting: Internal Medicine

## 2022-08-18 DIAGNOSIS — Z1231 Encounter for screening mammogram for malignant neoplasm of breast: Secondary | ICD-10-CM

## 2022-10-21 ENCOUNTER — Ambulatory Visit
Admission: RE | Admit: 2022-10-21 | Discharge: 2022-10-21 | Disposition: A | Discharge status: Home / Self Care | Payer: Medicare Other | Source: Ambulatory Visit | Attending: Internal Medicine | Admitting: Internal Medicine

## 2022-10-21 DIAGNOSIS — Z1231 Encounter for screening mammogram for malignant neoplasm of breast: Secondary | ICD-10-CM | POA: Insufficient documentation

## 2023-08-26 ENCOUNTER — Other Ambulatory Visit: Payer: Self-pay | Admitting: Internal Medicine

## 2023-08-26 DIAGNOSIS — Z1231 Encounter for screening mammogram for malignant neoplasm of breast: Secondary | ICD-10-CM

## 2023-10-13 NOTE — H&P (Incomplete)
 Pre-Procedure H&P   Patient ID: Kara Wood is a 78 y.o. female.  Gastroenterology Provider: Jaynie Collins, DO  Referring Provider: Fransico Setters, NP PCP: Kara Arbour, MD  Date: 10/14/2023  HPI [name] is a 78 y.o. female who presents today for Esophagogastroduodenoscopy and Colonoscopy for Iron deficiency anemia, diarrhea, family history of colon cancer .  Patient last underwent colonoscopy in September 2019 which was normal aside from internal hemorrhoids.   She has noted diarrhea.  Recent workup has been negative for infection, celiac disease and pancreatic insufficiency.  She denies melena and hematochezia  She recently stopped eating meat under direction of her provider but was noted to have a drop in her iron.  Hemoglobin reached 10.5 iron saturation 8 ferritin 6.  This improved to hemoglobin of 13.3 with an MCV of 91 and platelets of 228,000 with p.o. iron.  Creatinine 0.9.  Brother with a history of colon cancer in his 66s  Status post cholecystectomy  Past Medical History:  Diagnosis Date   Arthritis    Diabetes (HCC)    High cholesterol    Hypertension    Neuropathy in diabetes (HCC)    Stage 3 chronic kidney disease (HCC)     Past Surgical History:  Procedure Laterality Date   BACK SURGERY     CATARACT EXTRACTION W/PHACO Right 12/17/2014   Procedure: CATARACT EXTRACTION PHACO AND INTRAOCULAR LENS PLACEMENT (IOC);  Surgeon: Sallee Lange, MD;  Location: [facility] ORS;  Service: Ophthalmology;  Laterality: Right;  [place] 01:17 AP% 26.0 CDE 33.35   CHOLECYSTECTOMY      Family History Brother-colon cancer in his 43s No other h/o GI disease or malignancy  Review of Systems  Constitutional:  Negative for activity change, appetite change, chills, diaphoresis, fatigue, fever and unexpected weight change.  HENT:  Negative for trouble swallowing and voice change.   Respiratory:  Negative for shortness of breath and wheezing.   Cardiovascular:   Negative for chest pain, palpitations and leg swelling.  Gastrointestinal:  Positive for diarrhea. Negative for abdominal distention, abdominal pain, anal bleeding, blood in stool, constipation, nausea, rectal pain and vomiting.  Musculoskeletal:  Negative for arthralgias and myalgias.  Skin:  Negative for color change and pallor.  Neurological:  Negative for dizziness, syncope and weakness.  Psychiatric/Behavioral:  Negative for confusion.   All other systems reviewed and are negative.    Medications No current facility-administered medications on file prior to encounter.   Current Outpatient Medications on File Prior to Encounter  Medication Sig Dispense Refill   diphenhydrAMINE (BENADRYL) 25 mg capsule Take 25 mg by mouth every 6 (six) hours as needed.     glipiZIDE (GLUCOTROL) 10 MG tablet Take 10 mg by mouth daily before breakfast.     LANTUS SOLOSTAR 100 UNIT/ML Solostar Pen Inject into the skin.     metFORMIN (GLUCOPHAGE-XR) 500 MG 24 hr tablet Take 1,000 mg by mouth 2 (two) times daily.     nebivolol (BYSTOLIC) 10 MG tablet Take 10 mg by mouth daily.     pioglitazone (ACTOS) 15 MG tablet Take 15 mg by mouth daily.     rosuvastatin (CRESTOR) 10 MG tablet Take 10 mg by mouth daily.     valsartan (DIOVAN) 320 MG tablet Take 320 mg by mouth daily.     Absorbable Collagen Hemostat (ACTIFOAM COLLAGEN SPONGE) MISC Use 1,000 mg once daily     benzonatate (TESSALON) 100 MG capsule Take 2 capsules (200 mg total) by mouth  3 (three) times daily as needed for cough. 30 capsule 0   Biotin 5000 MCG SUBL Place under the tongue.     cetirizine (ZYRTEC) 10 MG tablet Take 10 mg by mouth daily.     doxycycline (VIBRAMYCIN) 100 MG capsule Take 1 capsule (100 mg total) by mouth 2 (two) times daily. (Patient not taking: Reported on 10/07/2023) 20 capsule 0   isosorbide dinitrate (ISORDIL) 30 MG tablet Take 30 mg by mouth daily. (Patient not taking: Reported on 10/07/2023)     loperamide (IMODIUM) 2 MG  capsule Take by mouth.     loratadine (CLARITIN) 10 MG tablet Take by mouth.     meclizine (ANTIVERT) 25 MG tablet Take by mouth.     meloxicam (MOBIC) 7.5 MG tablet Take 7.5 mg by mouth daily.     metFORMIN (GLUMETZA) 1000 MG (MOD) 24 hr tablet Take 1,000 mg by mouth 2 (two) times daily with a meal. (Patient not taking: Reported on 10/07/2023)     Omeprazole-Sodium Bicarbonate (ZEGERID) 20-1100 MG CAPS capsule Take 1 capsule by mouth 2 (two) times daily.     valsartan-hydrochlorothiazide (DIOVAN-HCT) 160-12.5 MG per tablet Take 1 tablet by mouth daily. (Patient not taking: Reported on 10/07/2023)     Vitamin E (VITAMIN E/D-ALPHA NATURAL) 268 MG (400 UNIT) CAPS Take by mouth.      Pertinent medications related to GI and procedure were reviewed by me with the patient prior to the procedure   Current Facility-Administered Medications:    0.9 %  sodium chloride infusion, , Intravenous, Continuous, Kara Collins, DO  sodium chloride         Allergies  Allergen Reactions   Nsaids Other (See Comments)   Ozempic (0.25 Or 0.5 Mg-Dose) [Semaglutide(0.25 Or 0.5mg -Dos)]    Trulicity [Dulaglutide]    Tolectin [Tolmetin] Rash   Allergies were reviewed by me prior to the procedure  Objective   Body mass index is 27.46 kg/m. Vitals:   10/14/23 0935 10/14/23 0952  BP:  (!) 148/57  Pulse:  61  Resp:  18  Temp:  97.8 F (36.6 C)  TempSrc:  Temporal  SpO2:  99%  Weight: 72.6 kg   Height: 5\' 4"  (1.626 m)      Physical Exam Vitals and nursing note reviewed.  Constitutional:      General: She is not in acute distress.    Appearance: Normal appearance. She is not ill-appearing, toxic-appearing or diaphoretic.  HENT:     Head: Normocephalic and atraumatic.     Nose: Nose normal.     Mouth/Throat:     Mouth: Mucous membranes are moist.     Pharynx: Oropharynx is clear.  Eyes:     General: No scleral icterus.    Extraocular Movements: Extraocular movements intact.   Cardiovascular:     Rate and Rhythm: Normal rate and regular rhythm.     Heart sounds: Murmur heard.     No friction rub. No gallop.  Pulmonary:     Effort: Pulmonary effort is normal. No respiratory distress.     Breath sounds: Normal breath sounds. No wheezing, rhonchi or rales.  Abdominal:     General: Bowel sounds are normal. There is no distension.     Palpations: Abdomen is soft.     Tenderness: There is no abdominal tenderness. There is no guarding or rebound.  Musculoskeletal:     Cervical back: Neck supple.     Right lower leg: No edema.     Left lower  leg: No edema.  Skin:    General: Skin is warm and dry.     Coloration: Skin is not jaundiced or pale.  Neurological:     General: No focal deficit present.     Mental Status: She is alert and oriented to person, place, and time. Mental status is at baseline.  Psychiatric:        Mood and Affect: Mood normal.        Behavior: Behavior normal.        Thought Content: Thought content normal.        Judgment: Judgment normal.      Assessment:  [name] is a 78 y.o. female  who presents today for Esophagogastroduodenoscopy and Colonoscopy for Iron deficiency anemia, diarrhea, family history of colon cancer .  Plan:  Esophagogastroduodenoscopy and Colonoscopy with possible intervention today  Esophagogastroduodenoscopy and Colonoscopy with possible biopsy, control of bleeding, polypectomy, and interventions as necessary has been discussed with the patient/patient representative. Informed consent was obtained from the patient/patient representative after explaining the indication, nature, and risks of the procedure including but not limited to death, bleeding, perforation, missed neoplasm/lesions, cardiorespiratory compromise, and reaction to medications. Opportunity for questions was given and appropriate answers were provided. Patient/patient representative has verbalized understanding is amenable to undergoing the  procedure.   Kara Collins, DO  [facility] Gastroenterology  Portions of the record may have been created with voice recognition software. Occasional wrong-word or 'sound-a-like' substitutions may have occurred due to the inherent limitations of voice recognition software.  Read the chart carefully and recognize, using context, where substitutions may have occurred.

## 2023-10-14 ENCOUNTER — Encounter: Payer: Self-pay | Admitting: Gastroenterology

## 2023-10-14 ENCOUNTER — Ambulatory Visit: Admitting: Registered Nurse

## 2023-10-14 ENCOUNTER — Ambulatory Visit
Admission: RE | Admit: 2023-10-14 | Discharge: 2023-10-14 | Disposition: A | Discharge status: Home / Self Care | Payer: Medicare Other | Source: Ambulatory Visit | Attending: Gastroenterology | Admitting: Gastroenterology

## 2023-10-14 ENCOUNTER — Encounter
Admission: RE | Disposition: A | Discharge status: Home / Self Care | Payer: Self-pay | Source: Ambulatory Visit | Attending: Gastroenterology

## 2023-10-14 DIAGNOSIS — E1122 Type 2 diabetes mellitus with diabetic chronic kidney disease: Secondary | ICD-10-CM | POA: Diagnosis not present

## 2023-10-14 DIAGNOSIS — Z800 Family history of malignant neoplasm of digestive organs: Secondary | ICD-10-CM | POA: Insufficient documentation

## 2023-10-14 DIAGNOSIS — K219 Gastro-esophageal reflux disease without esophagitis: Secondary | ICD-10-CM | POA: Insufficient documentation

## 2023-10-14 DIAGNOSIS — D125 Benign neoplasm of sigmoid colon: Secondary | ICD-10-CM | POA: Insufficient documentation

## 2023-10-14 DIAGNOSIS — Z9049 Acquired absence of other specified parts of digestive tract: Secondary | ICD-10-CM | POA: Diagnosis not present

## 2023-10-14 DIAGNOSIS — I129 Hypertensive chronic kidney disease with stage 1 through stage 4 chronic kidney disease, or unspecified chronic kidney disease: Secondary | ICD-10-CM | POA: Insufficient documentation

## 2023-10-14 DIAGNOSIS — Z7984 Long term (current) use of oral hypoglycemic drugs: Secondary | ICD-10-CM | POA: Insufficient documentation

## 2023-10-14 DIAGNOSIS — K2950 Unspecified chronic gastritis without bleeding: Secondary | ICD-10-CM | POA: Insufficient documentation

## 2023-10-14 DIAGNOSIS — D127 Benign neoplasm of rectosigmoid junction: Secondary | ICD-10-CM | POA: Diagnosis not present

## 2023-10-14 DIAGNOSIS — D509 Iron deficiency anemia, unspecified: Secondary | ICD-10-CM | POA: Insufficient documentation

## 2023-10-14 DIAGNOSIS — Z794 Long term (current) use of insulin: Secondary | ICD-10-CM | POA: Diagnosis not present

## 2023-10-14 DIAGNOSIS — D120 Benign neoplasm of cecum: Secondary | ICD-10-CM | POA: Insufficient documentation

## 2023-10-14 DIAGNOSIS — K529 Noninfective gastroenteritis and colitis, unspecified: Secondary | ICD-10-CM | POA: Insufficient documentation

## 2023-10-14 DIAGNOSIS — K3189 Other diseases of stomach and duodenum: Secondary | ICD-10-CM | POA: Insufficient documentation

## 2023-10-14 DIAGNOSIS — D123 Benign neoplasm of transverse colon: Secondary | ICD-10-CM | POA: Diagnosis not present

## 2023-10-14 DIAGNOSIS — N183 Chronic kidney disease, stage 3 unspecified: Secondary | ICD-10-CM | POA: Insufficient documentation

## 2023-10-14 HISTORY — PX: COLONOSCOPY WITH PROPOFOL: SHX5780

## 2023-10-14 HISTORY — PX: ESOPHAGOGASTRODUODENOSCOPY (EGD) WITH PROPOFOL: SHX5813

## 2023-10-14 HISTORY — PX: POLYPECTOMY: SHX5525

## 2023-10-14 LAB — GLUCOSE, CAPILLARY: Glucose-Capillary: 119 mg/dL — ABNORMAL HIGH (ref 70–99)

## 2023-10-14 SURGERY — COLONOSCOPY WITH PROPOFOL
Anesthesia: General

## 2023-10-14 MED ORDER — PROPOFOL 500 MG/50ML IV EMUL
INTRAVENOUS | Status: DC | PRN
  Administered 2023-10-14: 125 ug/kg/min via INTRAVENOUS

## 2023-10-14 MED ORDER — EPHEDRINE 5 MG/ML INJ
INTRAVENOUS | Status: AC
  Filled 2023-10-14: qty 5

## 2023-10-14 MED ORDER — GLYCOPYRROLATE 0.2 MG/ML IJ SOLN
INTRAMUSCULAR | Status: DC | PRN
Start: 2023-10-14 — End: 2023-10-14
  Administered 2023-10-14: .1 mg via INTRAVENOUS

## 2023-10-14 MED ORDER — EPHEDRINE SULFATE-NACL 50-0.9 MG/10ML-% IV SOSY
PREFILLED_SYRINGE | INTRAVENOUS | Status: DC | PRN
  Administered 2023-10-14: 5 mg via INTRAVENOUS

## 2023-10-14 MED ORDER — PHENYLEPHRINE 80 MCG/ML (10ML) SYRINGE FOR IV PUSH (FOR BLOOD PRESSURE SUPPORT)
PREFILLED_SYRINGE | INTRAVENOUS | Status: AC
  Filled 2023-10-14: qty 10

## 2023-10-14 MED ORDER — LIDOCAINE HCL (CARDIAC) PF 100 MG/5ML IV SOSY
PREFILLED_SYRINGE | INTRAVENOUS | Status: DC | PRN
  Administered 2023-10-14: 40 mg via INTRAVENOUS

## 2023-10-14 MED ORDER — PHENYLEPHRINE 80 MCG/ML (10ML) SYRINGE FOR IV PUSH (FOR BLOOD PRESSURE SUPPORT)
PREFILLED_SYRINGE | INTRAVENOUS | Status: DC | PRN
  Administered 2023-10-14 (×2): 80 ug via INTRAVENOUS

## 2023-10-14 MED ORDER — STERILE WATER FOR IRRIGATION IR SOLN
Status: DC | PRN
  Administered 2023-10-14: 60 mL

## 2023-10-14 MED ORDER — PROPOFOL 10 MG/ML IV BOLUS
INTRAVENOUS | Status: DC | PRN
  Administered 2023-10-14: 40 mg via INTRAVENOUS

## 2023-10-14 MED ORDER — SODIUM CHLORIDE 0.9 % IV SOLN: INTRAVENOUS | Status: DC

## 2023-10-14 NOTE — Interval H&P Note (Signed)
 History and Physical Interval Note: Preprocedure H&P from 10/14/23  was reviewed and there was no interval change after seeing and examining the patient.  Written consent was obtained from the patient after discussion of risks, benefits, and alternatives. Patient has consented to proceed with Esophagogastroduodenoscopy and Colonoscopy with possible intervention   10/14/2023 10:15 AM  Kara Wood  has presented today for surgery, with the diagnosis of R19.7 (ICD-10-CM) - Diarrhea, unspecified type Z80.0 (ICD-10-CM) - FH: colon cancer Z86.2 (ICD-10-CM) - History of anemia.  The various methods of treatment have been discussed with the patient and family. After consideration of risks, benefits and other options for treatment, the patient has consented to  Procedure(s) with comments: COLONOSCOPY WITH PROPOFOL (N/A) - DM ESOPHAGOGASTRODUODENOSCOPY (EGD) WITH PROPOFOL (N/A) as a surgical intervention.  The patient's history has been reviewed, patient examined, no change in status, stable for surgery.  I have reviewed the patient's chart and labs.  Questions were answered to the patient's satisfaction.     Jaynie Collins

## 2023-10-14 NOTE — Transfer of Care (Signed)
 Immediate Anesthesia Transfer of Care Note  Patient: Kara Wood  Procedure(s) Performed: COLONOSCOPY WITH PROPOFOL ESOPHAGOGASTRODUODENOSCOPY (EGD) WITH PROPOFOL POLYPECTOMY  Patient Location: PACU  Anesthesia Type:General  Level of Consciousness: awake, alert , and oriented  Airway & Oxygen Therapy: Patient Spontanous Breathing  Post-op Assessment: Report given to RN and Post -op Vital signs reviewed and stable  Post vital signs: stable  Last Vitals:  Vitals Value Taken Time  BP 141/59 10/14/23 1105  Temp    Pulse 84 10/14/23 1107  Resp 17 10/14/23 1107  SpO2 100 % 10/14/23 1107  Vitals shown include unfiled device data.  Last Pain:  Vitals:   10/14/23 0952  TempSrc: Temporal  PainSc: 0-No pain         Complications: No notable events documented.

## 2023-10-14 NOTE — Op Note (Signed)
 [facility] Gastroenterology Patient Name: Kara Wood Procedure Date: 10/14/2023 10:27 AM MRN: [id number] Account #: [id number] Date of Birth: [birth year] Admit Type: Outpatient Age: 78 Room: [facility] ENDO ROOM 1 Gender: Female Note Status: Finalized Instrument Name: Upper Endoscope [id number] Procedure:             Upper GI endoscopy Indications:           Iron deficiency anemia Providers:             Jaynie Collins DO, DO Medicines:             Monitored Anesthesia Care Complications:         No immediate complications. Estimated blood loss:                         Minimal. Procedure:             Pre-Anesthesia Assessment:                        - Prior to the procedure, a History and Physical was                         performed, and patient medications and allergies were                         reviewed. The patient is competent. The risks and                         benefits of the procedure and the sedation options and                         risks were discussed with the patient. All questions                         were answered and informed consent was obtained.                         Patient identification and proposed procedure were                         verified by the physician, the nurse, the anesthetist                         and the technician in the endoscopy suite. Mental                         Status Examination: alert and oriented. Airway                         Examination: normal oropharyngeal airway and neck                         mobility. Respiratory Examination: clear to                         auscultation. CV Examination: RRR, no murmurs, no S3                         or S4. Prophylactic Antibiotics: The patient does  not                         require prophylactic antibiotics. Prior                         Anticoagulants: The patient has taken no anticoagulant                         or antiplatelet agents. ASA Grade Assessment:  II - A                         patient with mild systemic disease. After reviewing                         the risks and benefits, the patient was deemed in                         satisfactory condition to undergo the procedure. The                         anesthesia plan was to use monitored anesthesia care                         (MAC). Immediately prior to administration of                         medications, the patient was re-assessed for adequacy                         to receive sedatives. The heart rate, respiratory                         rate, oxygen saturations, blood pressure, adequacy of                         pulmonary ventilation, and response to care were                         monitored throughout the procedure. The physical                         status of the patient was re-assessed after the                         procedure.                        After obtaining informed consent, the endoscope was                         passed under direct vision. Throughout the procedure,                         the patient's blood pressure, pulse, and oxygen                         saturations were monitored continuously. The Endoscope  was introduced through the mouth, and advanced to the                         third part of duodenum. The upper GI endoscopy was                         accomplished without difficulty. The patient tolerated                         the procedure well. Findings:      The duodenal bulb, first portion of the duodenum, second portion of the       duodenum and third portion of the duodenum were normal. Estimated blood       loss: none.      Diffuse atrophic mucosa was found in the entire examined stomach.       Biopsies were taken with a cold forceps for histology. Biopsies were       taken with a cold forceps for Helicobacter pylori testing. Estimated       blood loss was minimal. Imaging was performed using white light and        narrow band imaging to visualize the mucosa.      The exam of the stomach was otherwise normal.      The Z-line was regular. Estimated blood loss: none.      Esophagogastric landmarks were identified: the gastroesophageal junction       was found at 36 cm from the incisors.      The exam of the esophagus was otherwise normal. Impression:            - Normal duodenal bulb, first portion of the duodenum,                         second portion of the duodenum and third portion of                         the duodenum.                        - Gastric mucosal atrophy. Biopsied.                        - Z-line regular.                        - Esophagogastric landmarks identified. Recommendation:        - Patient has a contact number available for                         emergencies. The signs and symptoms of potential                         delayed complications were discussed with the patient.                         Return to normal activities tomorrow. Written                         discharge instructions were provided to the patient.                        -  Discharge patient to home.                        - Resume previous diet.                        - Continue present medications.                        - Await pathology results.                        - Return to GI clinic as previously scheduled.                        - The findings and recommendations were discussed with                         the patient.                        - proceed with colonoscopy. see report for further                         recommendations. Procedure Code(s):     --- Professional ---                        [phone], Esophagogastroduodenoscopy, flexible,                         transoral; with biopsy, single or multiple Diagnosis Code(s):     --- Professional ---                        K31.89, Other diseases of stomach and duodenum                        D50.9, Iron deficiency anemia,  unspecified CPT copyright 2022 [employer]. All rights reserved. The codes documented in this report are preliminary and upon coder review may  be revised to meet current compliance requirements. Attending Participation:      I personally performed the entire procedure. Elfredia Nevins, DO Jaynie Collins DO, DO 10/14/2023 10:34:44 AM This report has been signed electronically. Number of Addenda: 0 Note Initiated On: 10/14/2023 10:27 AM Estimated Blood Loss:  Estimated blood loss was minimal.      [facility]

## 2023-10-14 NOTE — Op Note (Addendum)
 [facility] Gastroenterology Patient Name: Kara Wood Procedure Date: 10/14/2023 10:04 AM MRN: [id number] Account #: [id number] Date of Birth: [birth year] Admit Type: Outpatient Age: 78 Room: [facility] ENDO ROOM 1 Gender: Female Note Status: Supervisor Override Instrument Name: Peds Colonoscope [id number] Procedure:             Colonoscopy Indications:           Family history of colon cancer in a first-degree                         relative before age 50 years, Chronic diarrhea, Iron                         deficiency anemia Providers:             Jaynie Collins DO, DO Medicines:             Monitored Anesthesia Care Complications:         No immediate complications. Estimated blood loss:                         Minimal. Procedure:             Pre-Anesthesia Assessment:                        - Prior to the procedure, a History and Physical was                         performed, and patient medications and allergies were                         reviewed. The patient is competent. The risks and                         benefits of the procedure and the sedation options and                         risks were discussed with the patient. All questions                         were answered and informed consent was obtained.                         Patient identification and proposed procedure were                         verified by the physician, the nurse, the anesthetist                         and the technician in the endoscopy suite. Mental                         Status Examination: alert and oriented. Airway                         Examination: normal oropharyngeal airway and neck                         mobility. Respiratory Examination: clear to  auscultation. CV Examination: RRR, no murmurs, no S3                         or S4. Prophylactic Antibiotics: The patient does not                         require prophylactic antibiotics. Prior                          Anticoagulants: The patient has taken no anticoagulant                         or antiplatelet agents. ASA Grade Assessment: II - A                         patient with mild systemic disease. After reviewing                         the risks and benefits, the patient was deemed in                         satisfactory condition to undergo the procedure. The                         anesthesia plan was to use monitored anesthesia care                         (MAC). Immediately prior to administration of                         medications, the patient was re-assessed for adequacy                         to receive sedatives. The heart rate, respiratory                         rate, oxygen saturations, blood pressure, adequacy of                         pulmonary ventilation, and response to care were                         monitored throughout the procedure. The physical                         status of the patient was re-assessed after the                         procedure.                        After obtaining informed consent, the colonoscope was                         passed under direct vision. Throughout the procedure,                         the patient's blood pressure, pulse, and oxygen  saturations were monitored continuously. The                         Colonoscope was introduced through the anus and                         advanced to the the terminal ileum, with                         identification of the appendiceal orifice and IC                         valve. The colonoscopy was performed without                         difficulty. The patient tolerated the procedure well.                         The quality of the bowel preparation was evaluated                         using the BBPS [facility] Bowel Preparation Scale) with                         scores of: Right Colon = 3, Transverse Colon = 3 and                          Left Colon = 3 (entire mucosa seen well with no                         residual staining, small fragments of stool or opaque                         liquid). The total BBPS score equals 9. The terminal                         ileum, ileocecal valve, appendiceal orifice, and                         rectum were photographed. Findings:      The perianal and digital rectal examinations were normal. Pertinent       negatives include normal sphincter tone.      The terminal ileum appeared normal. Estimated blood loss: none.      Retroflexion in the right colon was performed.      Three sessile polyps were found in the recto-sigmoid colon, transverse       colon and appendiceal orifice. The polyps were 3 to 6 mm in size. These       polyps were removed with a cold snare. Resection and retrieval were       complete. Estimated blood loss was minimal.      The exam was otherwise without abnormality on direct and retroflexion       views. Impression:            - The examined portion of the ileum was normal.                        - Three 3  to 6 mm polyps at the recto-sigmoid colon,                         in the transverse colon and at the appendiceal                         orifice, removed with a cold snare. Resected and                         retrieved.                        - The examination was otherwise normal on direct and                         retroflexion views. Recommendation:        - Patient has a contact number available for                         emergencies. The signs and symptoms of potential                         delayed complications were discussed with the patient.                         Return to normal activities tomorrow. Written                         discharge instructions were provided to the patient.                        - Discharge patient to home.                        - Resume previous diet.                        - Continue present medications.                         - No ibuprofen, naproxen, or other non-steroidal                         anti-inflammatory drugs for 5 days after polyp removal.                        - Await pathology results.                        - No further surveillance/screening colonoscopy given                         advanced age at next due.                        - Return to GI clinic as previously scheduled.                        - Try taking Colestid at Lunch time to see if this  helps with your diarrhea. This way you can space it                         apart from your other medications.                        - The findings and recommendations were discussed with                         the patient. Procedure Code(s):     --- Professional ---                        [phone], Colonoscopy, flexible; with removal of                         tumor(s), polyp(s), or other lesion(s) by snare                         technique Diagnosis Code(s):     --- Professional ---                        D12.7, Benign neoplasm of rectosigmoid junction                        D12.3, Benign neoplasm of transverse colon (hepatic                         flexure or splenic flexure)                        D12.1, Benign neoplasm of appendix                        K52.9, Noninfective gastroenteritis and colitis,                         unspecified                        D50.9, Iron deficiency anemia, unspecified CPT copyright 2022 American Medical Association. All rights reserved. The codes documented in this report are preliminary and upon coder review may  be revised to meet current compliance requirements. Attending Participation:      I personally performed the entire procedure. Elfredia Nevins, DO Jaynie Collins DO, DO 10/14/2023 11:02:31 AM This report has been signed electronically. Number of Addenda: 0 Note Initiated On: 10/14/2023 10:04 AM Scope Withdrawal Time: 0 hours 13 minutes 26 seconds  Total  Procedure Duration: 0 hours 15 minutes 31 seconds  Estimated Blood Loss:  Estimated blood loss was minimal.      [facility]

## 2023-10-14 NOTE — Anesthesia Preprocedure Evaluation (Signed)
 Anesthesia Evaluation  Patient identified by MRN, date of birth, ID band Patient awake    Reviewed: Allergy & Precautions, H&P , NPO status , Patient's Chart, lab work & pertinent test results, reviewed documented beta blocker date and time   History of Anesthesia Complications Negative for: history of anesthetic complications  Airway Mallampati: III  TM Distance: >3 FB Neck ROM: full    Dental  (+) Dental Advidsory Given, Caps, Implants   Pulmonary neg pulmonary ROS   Pulmonary exam normal breath sounds clear to auscultation       Cardiovascular Exercise Tolerance: Good hypertension, (-) angina (-) Past MI and (-) Cardiac Stents Normal cardiovascular exam(-) dysrhythmias (-) Valvular Problems/Murmurs Rhythm:regular Rate:Normal     Neuro/Psych negative neurological ROS  negative psych ROS   GI/Hepatic Neg liver ROS,GERD  Medicated and Controlled,,  Endo/Other  diabetes    Renal/GU CRFRenal disease (stage 3)  negative genitourinary   Musculoskeletal   Abdominal   Peds  Hematology negative hematology ROS (+)   Anesthesia Other Findings Past Medical History: No date: Arthritis No date: Diabetes (HCC) No date: High cholesterol No date: Hypertension No date: Neuropathy in diabetes (HCC) No date: Stage 3 chronic kidney disease (HCC)   Reproductive/Obstetrics negative OB ROS                             Anesthesia Physical Anesthesia Plan  ASA: 2  Anesthesia Plan: General   Post-op Pain Management:    Induction: Intravenous  PONV Risk Score and Plan: 3 and Propofol infusion, TIVA and Treatment may vary due to age or medical condition  Airway Management Planned: Natural Airway and Nasal Cannula  Additional Equipment:   Intra-op Plan:   Post-operative Plan:   Informed Consent: I have reviewed the patients History and Physical, chart, labs and discussed the procedure including  the risks, benefits and alternatives for the proposed anesthesia with the patient or authorized representative who has indicated his/her understanding and acceptance.     Dental Advisory Given  Plan Discussed with: Anesthesiologist, CRNA and Surgeon  Anesthesia Plan Comments:        Anesthesia Quick Evaluation

## 2023-10-14 NOTE — Anesthesia Postprocedure Evaluation (Signed)
 Anesthesia Post Note  Patient: Audia B Lubitz  Procedure(s) Performed: COLONOSCOPY WITH PROPOFOL ESOPHAGOGASTRODUODENOSCOPY (EGD) WITH PROPOFOL POLYPECTOMY  Patient location during evaluation: Endoscopy Anesthesia Type: General Level of consciousness: awake and alert Pain management: pain level controlled Vital Signs Assessment: post-procedure vital signs reviewed and stable Respiratory status: spontaneous breathing, nonlabored ventilation, respiratory function stable and patient connected to nasal cannula oxygen Cardiovascular status: blood pressure returned to baseline and stable Postop Assessment: no apparent nausea or vomiting Anesthetic complications: no   No notable events documented.   Last Vitals:  Vitals:   10/14/23 1115 10/14/23 1125  BP: (!) 163/53 (!) 175/71  Pulse: 77 71  Resp: 11 (!) 24  Temp:    SpO2: 99% 100%    Last Pain:  Vitals:   10/14/23 1125  TempSrc:   PainSc: 0-No pain                 Lenard Simmer

## 2023-10-15 ENCOUNTER — Encounter: Payer: Self-pay | Admitting: Gastroenterology

## 2023-10-18 LAB — SURGICAL PATHOLOGY

## 2023-11-03 ENCOUNTER — Ambulatory Visit
Admission: RE | Admit: 2023-11-03 | Discharge: 2023-11-03 | Disposition: A | Discharge status: Home / Self Care | Payer: Medicare Other | Source: Ambulatory Visit | Attending: Internal Medicine | Admitting: Internal Medicine

## 2023-11-03 DIAGNOSIS — Z1231 Encounter for screening mammogram for malignant neoplasm of breast: Secondary | ICD-10-CM | POA: Diagnosis present

## 2024-01-10 NOTE — Therapy (Signed)
 OUTPATIENT OCCUPATIONAL THERAPY ORTHO EVALUATION  Patient Name: Kara Wood MRN: [id number] DOB:08-Aug-1946, 78 y.o., female Today's Date: 01/11/2024  PCP: Dr Claudius Cumins REFERRING PROVIDER: Dr Mozell Arias  END OF SESSION:  OT End of Session - 01/11/24 1034     Visit Number 1    Number of Visits 10    Date for OT Re-Evaluation 03/07/24    OT Start Time 1034    OT Stop Time 1117    OT Time Calculation (min) 43 min    Activity Tolerance Patient tolerated treatment well    Behavior During Therapy WFL for tasks assessed/performed             Past Medical History:  Diagnosis Date   Arthritis    Diabetes (HCC)    High cholesterol    Hypertension    Neuropathy in diabetes (HCC)    Stage 3 chronic kidney disease (HCC)    Past Surgical History:  Procedure Laterality Date   BACK SURGERY     CATARACT EXTRACTION W/PHACO Right 12/17/2014   Procedure: CATARACT EXTRACTION PHACO AND INTRAOCULAR LENS PLACEMENT (IOC);  Surgeon: Steven Dingeldein, MD;  Location: [facility] ORS;  Service: Ophthalmology;  Laterality: Right;  US  01:17 AP% 26.0 CDE 33.35   CHOLECYSTECTOMY     COLONOSCOPY WITH PROPOFOL  N/A 10/14/2023   Procedure: COLONOSCOPY WITH PROPOFOL ;  Surgeon: Quintin Buckle, DO;  Location: [facility] ENDOSCOPY;  Service: Gastroenterology;  Laterality: N/A;  DM   ESOPHAGOGASTRODUODENOSCOPY (EGD) WITH PROPOFOL  N/A 10/14/2023   Procedure: ESOPHAGOGASTRODUODENOSCOPY (EGD) WITH PROPOFOL ;  Surgeon: Quintin Buckle, DO;  Location: [facility] ENDOSCOPY;  Service: Gastroenterology;  Laterality: N/A;   POLYPECTOMY  10/14/2023   Procedure: POLYPECTOMY;  Surgeon: Quintin Buckle, DO;  Location: [facility] ENDOSCOPY;  Service: Gastroenterology;;   There are no active problems to display for this patient.   ONSET DATE: 6 months  REFERRING DIAG: Trigger fingers in bilateral hands   THERAPY DIAG:  Trigger index finger of left hand  Trigger finger, left ring finger  Trigger finger, right index finger  Stiffness  of joints of both hands  Muscle weakness (generalized)  Rationale for Evaluation and Treatment: Rehabilitation  SUBJECTIVE:   SUBJECTIVE STATEMENT: I trigger fingers I had for a while.  But I cannot have shots because of my diabetes.  Both my index fingers locks.  But the ring finger locks to and have increased swelling in the morning.  And this morning in the middle finger on the left hand locked down.  I have stiffness in the morning and then my left hand is worse than the right. Pt accompanied by: self  PERTINENT HISTORY: 12/20/23 Dr Mozell Arias Note - Assessment/Plan:   Assessment & Plan Trigger finger  She has bilateral index finger trigger finger, with the left worse than the right, and the left ring finger is also affected but cannot flex to lock. Hyperglycemia likely exacerbates the condition. There are concerns about complications from corticosteroid injections due to elevated A1c levels (8.2). Informed consent covered risks of injections, especially with diabetes, and the potential need for surgery if conservative measures fail. Refer to hand therapy for bilateral index and left ring finger trigger finger. Use Voltaren gel topically on affected fingers three to four times a day. Avoid oral NSAIDs due to potential kidney impact. Re-evaluate after a few weeks of hand therapy; consider corticosteroid injection if no improvement. Discuss potential need for surgery if conservative measures fail and blood sugar levels improve.  Type 2 diabetes mellitus with hyperglycemia  She  has type 2 diabetes mellitus with a recent A1c of 8.2, indicating poor glycemic control. Current medications include metformin, pioglitazone (Actos), and Lantus insulin. Hyperglycemia is a concern for potential complications with trigger finger treatment. Encourage strict glycemic control to potentially improve trigger finger symptoms and reduce risks associated with corticosteroid injections. Diagnoses and all orders for this  visit:  Trigger index finger of left hand - Ambulatory Referral to Occupational Therapy  Trigger index finger of right hand - Ambulatory Referral to Occupational Therapy  Trigger ring finger of left hand - Ambulatory Referral to Occupational Therapy   PRECAUTIONS: None    WEIGHT BEARING RESTRICTIONS: No  PAIN:  Are you having pain? 6/10 pain hands or A1 pulleys  FALLS: Has patient fallen in last 6 months? No  LIVING ENVIRONMENT: Lives with: lives with their spouse   PLOF: Independent in basic ADLs and lighthouse activities. Patient mostly watching TV and on her phone.  And read a lot.  Do some laundry and cooking  PATIENT GOALS: Wants to get my trigger fingers better and avoid surgery.  NEXT MD VISIT:  4 wk after OT   OBJECTIVE:  Note: Objective measures were completed at Evaluation unless otherwise noted.  HAND DOMINANCE: Left  ADLs: Patient report increase of dropping objects.  As well as when gripping with fitting sheet or laundry or cutting food increased pain as well as locking of fingers in bilateral hands with the left worse than the right.   FUNCTIONAL OUTCOME MEASURES: Reassess next session  UPPER EXTREMITY ROM:   Wrist active range of motion in forearm within normal limits pain-free  Active ROM Right eval Left eval  Thumb MCP (0-60)    Thumb IP (0-80)    Thumb Radial abd/add (0-55)     Thumb Palmar abd/add (0-45)     Thumb Opposition to Small Finger     Index MCP (0-90) 80 locking in am  80  locking every time  Index PIP (0-100) 100  90  Index DIP (0-70)      Long MCP (0-90)  85  80 locked this am   Long PIP (0-100)  100  90  Long DIP (0-70)      Ring MCP (0-90)  85  80 increase edema   Ring PIP (0-100)  100  90  Ring DIP (0-70)      Little MCP (0-90)  90  80  Little PIP (0-100)  100  90  Little DIP (0-70)      (Blank rows = not tested) Patient with bilateral thumb CMC pain.  HAND FUNCTION: Grip strength: Right: 40 lbs; Left: 21 lbs,  Lateral pinch: Right: 6 lbs, Left: 5 lbs, and 3 point pinch: Right: 5 lbs, Left: 6 lbs pain in the palm of bilateral hands left worse than the right  COORDINATION: Within functional limits have a hard time because of her thumbs with buttons and zippers and small objects  SENSATION: Patient wears a wrist brace at nighttime on the left hand report numbness at night if not wearing the brace  EDEMA: Left fourth digit  COGNITION: Overall cognitive status: Within functional limits for tasks assessed      TREATMENT DATE: 01/11/24  Patient to perform 2-3 times a day contrast to decrease edema and pain and inflammation in bilateral hands. Fabricated patient a MC block splint for the left second digit to wear at nighttime as well as during the day with any gripping or composite flexion activities that she can unmodified. MC block splint is for patient to wear to prevent triggering or locking. Patient was educated in use of splints.  As well as wearing in donning and doffing. Patient verbalized understanding.  Patient can do ice massage over the A1 pulleys of bilateral hands during the day several times.  After contrast patient to do MCP flexion with PIP extension 10 reps As well as 10 reps of intrinsic assist.  Modifications and joint protection was done with patient especially in the kitchen activities Using a phone as well as reading. Patient to use larger joints forearm and palms to carry and lift objects As well as to avoid tight sustained grips enlarging her grips. Reviewed some of patient's activities during the day.  Clinic using the remote, phone and reading.  As well as cooking and laundry.    PATIENT EDUCATION: Education details: findings of eval and HEP  Person educated: Patient Education method: Explanation, Demonstration, Tactile cues, Verbal cues, and  Handouts Education comprehension: verbalized understanding, returned demonstration, verbal cues required, and needs further education     GOALS: Goals reviewed with patient? Yes  LONG TERM GOALS: Target date: 8 wks   Patient be independent home program of wearing splint as well as home exercises for modalities and modifications to decrease pain in the left hand less than 2/10. Baseline: Patient with pain 6/10 with tenderness over the A1 pulleys of the 2nd and 4th.  Increased edema at the fourth locking every attempt of flexion of the second.  Morning locking on the 3rd and 4th. Goal status: INITIAL  2.  Patient to verbalize decrease triggering of locking of bilateral hands to less than 3 times a day. Baseline: Left hand triggering in the morning on the 3rd and 4th.  Second every attempt of flexion.  Right index finger locking in the morning. Goal status: INITIAL  3.  Patient to verbalize 3-4 modifications or joint protection that she implemented to decrease triggering or locking of bilateral hands as well as decreasing pain Baseline: Patient no knowledge of modifications and joint protection. Goal status: INITIAL  4.  Left hand digit flexion improved to within normal limits pain-free and symptom-free. Baseline: Left MC flexion 80 degrees and PIP is 90 degrees.  With tenderness and pain 6/10. Goal status: INITIAL  5.  Right grip strength improved with more than 5 pounds for patient to be able to use left hand with less triggering and brushing her teeth, using a knife, using her hands and bathing and dressing. Baseline:  Goal status: INITIAL ASSESSMENT:  CLINICAL IMPRESSION: Patient seen today for occupational therapy evaluation for bilateral hands with trigger fingers with the left worse than the right.  With a left index finger worse in the right index finger.  With increased edema in the 3rd and 4th digits with triggering at the the morning.  Patient pain 6/10 over the A1 pulleys.   Patient with decreased grip strength on the left more than the right.  With decreased prehension strength.  Patient is left-hand dominant.  Patient limited in functional use of bilateral hands in ADLs IADLs because of triggering.  Increased pain, increased edema and decreased motion in the left more than the right with decreased strength.  Patient can benefit from skilled OT services to decrease pain, edema and triggering and increased motion and strength to return to prior level of function to avoid having the shots or surgery.  Patient is diabetic.  PERFORMANCE DEFICITS: in functional skills including ADLs, IADLs, ROM, strength, pain, flexibility, decreased knowledge of use of DME, and UE functional use,   and psychosocial skills including environmental adaptation and routines and behaviors.   IMPAIRMENTS: are limiting patient from ADLs, IADLs, rest and sleep, play, leisure, and social participation.   COMORBIDITIES: has no other co-morbidities that affects occupational performance. Patient will benefit from skilled OT to address above impairments and improve overall function.  MODIFICATION OR ASSISTANCE TO COMPLETE EVALUATION: No modification of tasks or assist necessary to complete an evaluation.  OT OCCUPATIONAL PROFILE AND HISTORY: Problem focused assessment: Including review of records relating to presenting problem.  CLINICAL DECISION MAKING: LOW - limited treatment options, no task modification necessary  REHAB POTENTIAL: Good for goals  EVALUATION COMPLEXITY: Low      PLAN:  OT FREQUENCY: 1-2x/week  OT DURATION: 8 wks   PLANNED INTERVENTIONS: 97168 OT Re-evaluation, 97535 self care/ADL training, 16109 therapeutic exercise, 97530 therapeutic activity, 97112 neuromuscular re-education, 97140 manual therapy, 97035 ultrasound, 97018 paraffin, 60454 fluidotherapy, 97034 contrast bath, 97033 iontophoresis, 97760 Orthotic Initial, [id number] Orthotic/Prosthetic subsequent, patient/family  education, and DME and/or AE instructions    CONSULTED AND AGREED WITH PLAN OF CARE: Patient     Heloise Lobo, OTR/L,CLT 01/11/2024, 5:55 PM

## 2024-01-11 ENCOUNTER — Ambulatory Visit: Attending: Orthopedic Surgery | Admitting: Occupational Therapy

## 2024-01-11 ENCOUNTER — Encounter: Payer: Self-pay | Admitting: Occupational Therapy

## 2024-01-11 DIAGNOSIS — M6281 Muscle weakness (generalized): Secondary | ICD-10-CM | POA: Diagnosis present

## 2024-01-11 DIAGNOSIS — M65322 Trigger finger, left index finger: Secondary | ICD-10-CM | POA: Insufficient documentation

## 2024-01-11 DIAGNOSIS — M65321 Trigger finger, right index finger: Secondary | ICD-10-CM | POA: Diagnosis present

## 2024-01-11 DIAGNOSIS — M65342 Trigger finger, left ring finger: Secondary | ICD-10-CM | POA: Diagnosis present

## 2024-01-11 DIAGNOSIS — M25641 Stiffness of right hand, not elsewhere classified: Secondary | ICD-10-CM | POA: Diagnosis present

## 2024-01-11 DIAGNOSIS — M25642 Stiffness of left hand, not elsewhere classified: Secondary | ICD-10-CM | POA: Insufficient documentation

## 2024-01-18 ENCOUNTER — Ambulatory Visit: Admitting: Occupational Therapy

## 2024-01-18 DIAGNOSIS — M6281 Muscle weakness (generalized): Secondary | ICD-10-CM

## 2024-01-18 DIAGNOSIS — M25641 Stiffness of right hand, not elsewhere classified: Secondary | ICD-10-CM

## 2024-01-18 DIAGNOSIS — M65322 Trigger finger, left index finger: Secondary | ICD-10-CM

## 2024-01-18 DIAGNOSIS — M65342 Trigger finger, left ring finger: Secondary | ICD-10-CM

## 2024-01-18 DIAGNOSIS — M65321 Trigger finger, right index finger: Secondary | ICD-10-CM

## 2024-01-18 NOTE — Therapy (Signed)
 OUTPATIENT OCCUPATIONAL THERAPY ORTHO TREATMENT  Patient Name: Kara Wood MRN: [id number] DOB:1945-11-26, 78 y.o., female Today's Date: 01/18/2024  PCP: Dr Claudius Cumins REFERRING PROVIDER: Dr Mozell Arias  END OF SESSION:  OT End of Session - 01/18/24 1839     Visit Number 2    Number of Visits 10    Date for OT Re-Evaluation 03/07/24    OT Start Time 1450    OT Stop Time 1538    OT Time Calculation (min) 48 min    Activity Tolerance Patient tolerated treatment well    Behavior During Therapy WFL for tasks assessed/performed             Past Medical History:  Diagnosis Date   Arthritis    Diabetes (HCC)    High cholesterol    Hypertension    Neuropathy in diabetes (HCC)    Stage 3 chronic kidney disease (HCC)    Past Surgical History:  Procedure Laterality Date   BACK SURGERY     CATARACT EXTRACTION W/PHACO Right 12/17/2014   Procedure: CATARACT EXTRACTION PHACO AND INTRAOCULAR LENS PLACEMENT (IOC);  Surgeon: Steven Dingeldein, MD;  Location: [facility] ORS;  Service: Ophthalmology;  Laterality: Right;  US  01:17 AP% 26.0 CDE 33.35   CHOLECYSTECTOMY     COLONOSCOPY WITH PROPOFOL  N/A 10/14/2023   Procedure: COLONOSCOPY WITH PROPOFOL ;  Surgeon: Quintin Buckle, DO;  Location: [facility] ENDOSCOPY;  Service: Gastroenterology;  Laterality: N/A;  DM   ESOPHAGOGASTRODUODENOSCOPY (EGD) WITH PROPOFOL  N/A 10/14/2023   Procedure: ESOPHAGOGASTRODUODENOSCOPY (EGD) WITH PROPOFOL ;  Surgeon: Quintin Buckle, DO;  Location: [facility] ENDOSCOPY;  Service: Gastroenterology;  Laterality: N/A;   POLYPECTOMY  10/14/2023   Procedure: POLYPECTOMY;  Surgeon: Quintin Buckle, DO;  Location: [facility] ENDOSCOPY;  Service: Gastroenterology;;   There are no active problems to display for this patient.   ONSET DATE: 6 months  REFERRING DIAG: Trigger fingers in bilateral hands   THERAPY DIAG:  Trigger index finger of left hand  Trigger finger, left ring finger  Trigger finger, right index finger  Stiffness  of joints of both hands  Muscle weakness (generalized)  Rationale for Evaluation and Treatment: Rehabilitation  SUBJECTIVE:   SUBJECTIVE STATEMENT: I been wearing the splint on my left index finger.  The little compression sleeve on my ring finger helped really for the swelling and the pain.  But my fingers is stiff.  And then mostly locking in the morning when I like brush my hair or hold my coffee mug.  I am using Styrofoam now for it not to lock and some pain if I try and make a fist.  Especially the left. Pt accompanied by: self  PERTINENT HISTORY: 12/20/23 Dr Mozell Arias Note - Assessment/Plan:   Assessment & Plan Trigger finger  She has bilateral index finger trigger finger, with the left worse than the right, and the left ring finger is also affected but cannot flex to lock. Hyperglycemia likely exacerbates the condition. There are concerns about complications from corticosteroid injections due to elevated A1c levels (8.2). Informed consent covered risks of injections, especially with diabetes, and the potential need for surgery if conservative measures fail. Refer to hand therapy for bilateral index and left ring finger trigger finger. Use Voltaren gel topically on affected fingers three to four times a day. Avoid oral NSAIDs due to potential kidney impact. Re-evaluate after a few weeks of hand therapy; consider corticosteroid injection if no improvement. Discuss potential need for surgery if conservative measures fail and blood sugar levels improve.  Type 2 diabetes mellitus with hyperglycemia  She has type 2 diabetes mellitus with a recent A1c of 8.2, indicating poor glycemic control. Current medications include metformin, pioglitazone (Actos), and Lantus insulin. Hyperglycemia is a concern for potential complications with trigger finger treatment. Encourage strict glycemic control to potentially improve trigger finger symptoms and reduce risks associated with corticosteroid  injections. Diagnoses and all orders for this visit:  Trigger index finger of left hand - Ambulatory Referral to Occupational Therapy  Trigger index finger of right hand - Ambulatory Referral to Occupational Therapy  Trigger ring finger of left hand - Ambulatory Referral to Occupational Therapy   PRECAUTIONS: None    WEIGHT BEARING RESTRICTIONS: No  PAIN:  Are you having pain? 4-5/10 pain hands or A1 pulleys  FALLS: Has patient fallen in last 6 months? No  LIVING ENVIRONMENT: Lives with: lives with their spouse   PLOF: Independent in basic ADLs and lighthouse activities. Patient mostly watching TV and on her phone.  And read a lot.  Do some laundry and cooking  PATIENT GOALS: Wants to get my trigger fingers better and avoid surgery.  NEXT MD VISIT:  4 wk after OT   OBJECTIVE:  Note: Objective measures were completed at Evaluation unless otherwise noted.  HAND DOMINANCE: Left  ADLs: Patient report increase of dropping objects.  As well as when gripping with fitting sheet or laundry or cutting food increased pain as well as locking of fingers in bilateral hands with the left worse than the right.   FUNCTIONAL OUTCOME MEASURES: Reassess next session  UPPER EXTREMITY ROM:   Wrist active range of motion in forearm within normal limits pain-free  Active ROM Right eval Left eval  Thumb MCP (0-60)    Thumb IP (0-80)    Thumb Radial abd/add (0-55)     Thumb Palmar abd/add (0-45)     Thumb Opposition to Small Finger     Index MCP (0-90) 80 locking in am  80  locking every time  Index PIP (0-100) 100  90  Index DIP (0-70)      Long MCP (0-90)  85  80 locked this am   Long PIP (0-100)  100  90  Long DIP (0-70)      Ring MCP (0-90)  85  80 increase edema   Ring PIP (0-100)  100  90  Ring DIP (0-70)      Little MCP (0-90)  90  80  Little PIP (0-100)  100  90  Little DIP (0-70)      (Blank rows = not tested) Patient with bilateral thumb CMC pain.  HAND  FUNCTION: Grip strength: Right: 40 lbs; Left: 21 lbs, Lateral pinch: Right: 6 lbs, Left: 5 lbs, and 3 point pinch: Right: 5 lbs, Left: 6 lbs pain in the palm of bilateral hands left worse than the right  COORDINATION: Within functional limits have a hard time because of her thumbs with buttons and zippers and small objects  SENSATION: Patient wears a wrist brace at nighttime on the left hand report numbness at night if not wearing the brace  EDEMA: Left fourth digit  COGNITION: Overall cognitive status: Within functional limits for tasks assessed      TREATMENT DATE: 01/18/24    Patient presents with decreased edema decreased pain in the left fourth digit. Continue to have trigger fingers of bilateral index as well as fourth digit on the left. Decreased range of motion mostly in the left 2nd and 4th digit. Patient  reports wearing the [facility] block splint on the left at nighttime.  Reviewed with patient again contrast use especially in the morning and then maybe 2 more times to decrease edema and pain and increased motion prior to home exercises. 8 minutes done rotating hot and cold to decrease inflammation and pain and increased motion.                                                                                                                          Patient to perform 2-3 times a day contrast to decrease edema and pain and inflammation in bilateral hands. Fabricated patient a MC block splint for the right second digit to wear at nighttime as well as during the day with any gripping or composite flexion activities that she can unmodified. Patient is wearing the left but needs some education about activities that increases triggering. As well as use of MC block splint. MC block splint is for patient to wear to prevent triggering or locking. Patient was educated in use of splints.  As well as wearing in donning and doffing. Patient verbalized understanding.  Remind patient again that  she can do  ice massage over the A1 pulleys of bilateral hands that is tender during the day several times.  To decrease tenderness and pain and inflammation.  Prior to range of motion done Graston tool #2 for gentle brushing over volar palm and digits prior to range of motion to increase blood flow decrease pain and increased motion. Patient to do after contrast MCP flexion with PIP extension 10 reps And reviewed with patient and change home program to passive range of motion to bilateral second digits in the left fourth for DIP PIP flexion followed by Passive range of motion for intrinsic assist pain-free.  10 reps Patient can follow-up by gentle passive range of motion composite flexion pain-free.  Modifications and joint protection again done with patient  especially in the kitchen activities Using a phone as well as reading. Patient to use larger joints forearm and palms to carry and lift objects As well as to avoid tight sustained grips enlarging her grips. Reviewed some of patient's activities during the day.  Clinic using the remote, phone and reading.  As well as cooking and laundry.    PATIENT EDUCATION: Education details: findings of eval and HEP  Person educated: Patient Education method: Explanation, Demonstration, Tactile cues, Verbal cues, and Handouts Education comprehension: verbalized understanding, returned demonstration, verbal cues required, and needs further education     GOALS: Goals reviewed with patient? Yes  LONG TERM GOALS: Target date: 8 wks   Patient be independent home program of wearing splint as well as home exercises for modalities and modifications to decrease pain in the left hand less than 2/10. Baseline: Patient with pain 6/10 with tenderness over the A1 pulleys of the 2nd and 4th.  Increased edema at the fourth locking every attempt of flexion of the second.  Morning locking on the 3rd  and 4th. Goal status: INITIAL  2.  Patient to verbalize  decrease triggering of locking of bilateral hands to less than 3 times a day. Baseline: Left hand triggering in the morning on the 3rd and 4th.  Second every attempt of flexion.  Right index finger locking in the morning. Goal status: INITIAL  3.  Patient to verbalize 3-4 modifications or joint protection that she implemented to decrease triggering or locking of bilateral hands as well as decreasing pain Baseline: Patient no knowledge of modifications and joint protection. Goal status: INITIAL  4.  Left hand digit flexion improved to within normal limits pain-free and symptom-free. Baseline: Left MC flexion 80 degrees and PIP is 90 degrees.  With tenderness and pain 6/10. Goal status: INITIAL  5.  Right grip strength improved with more than 5 pounds for patient to be able to use left hand with less triggering and brushing her teeth, using a knife, using her hands and bathing and dressing. Baseline:  Goal status: INITIAL ASSESSMENT:  CLINICAL IMPRESSION: Patient seen today for occupational therapy evaluation for bilateral hands with trigger fingers with the left worse than the right.  With a left index finger worse in the right index finger.  With increased edema in the 3rd and 4th digits with triggering at the the morning.  Patient pain 6/10 over the A1 pulleys.  Patient with decreased grip strength on the left more than the right.  With decreased prehension strength.  Patient is left-hand dominant.   NOW fabricated for patient to right MC block splint for second digit.  Same use of the left.  Patient with decreased edema and pain in left fourth digit.  Reviewed with patient gentle passive range of motion for DIP PIP flexion followed by intrinsic a fist.  Reinforced with patient again splinting use as well as home program and modifications.  Patient limited in functional use of bilateral hands in ADLs IADLs because of triggering.  Increased pain, increased edema and decreased motion in the left  more than the right with decreased strength.  Patient can benefit from skilled OT services to decrease pain, edema and triggering and increased motion and strength to return to prior level of function to avoid having the shots or surgery.  Patient is diabetic.  PERFORMANCE DEFICITS: in functional skills including ADLs, IADLs, ROM, strength, pain, flexibility, decreased knowledge of use of DME, and UE functional use,   and psychosocial skills including environmental adaptation and routines and behaviors.   IMPAIRMENTS: are limiting patient from ADLs, IADLs, rest and sleep, play, leisure, and social participation.   COMORBIDITIES: has no other co-morbidities that affects occupational performance. Patient will benefit from skilled OT to address above impairments and improve overall function.  MODIFICATION OR ASSISTANCE TO COMPLETE EVALUATION: No modification of tasks or assist necessary to complete an evaluation.  OT OCCUPATIONAL PROFILE AND HISTORY: Problem focused assessment: Including review of records relating to presenting problem.  CLINICAL DECISION MAKING: LOW - limited treatment options, no task modification necessary  REHAB POTENTIAL: Good for goals  EVALUATION COMPLEXITY: Low      PLAN:  OT FREQUENCY: 1-2x/week  OT DURATION: 8 wks   PLANNED INTERVENTIONS: 97168 OT Re-evaluation, 97535 self care/ADL training, 66440 therapeutic exercise, 97530 therapeutic activity, 97112 neuromuscular re-education, 97140 manual therapy, 97035 ultrasound, 97018 paraffin, 34742 fluidotherapy, 97034 contrast bath, 97033 iontophoresis, 97760 Orthotic Initial, [id number] Orthotic/Prosthetic subsequent, patient/family education, and DME and/or AE instructions    CONSULTED AND AGREED WITH PLAN OF CARE: Patient  Heloise Lobo, OTR/L,CLT 01/18/2024, 6:41 PM

## 2024-01-21 ENCOUNTER — Ambulatory Visit: Admitting: Occupational Therapy

## 2024-01-28 ENCOUNTER — Ambulatory Visit: Admitting: Occupational Therapy

## 2024-01-28 DIAGNOSIS — M65322 Trigger finger, left index finger: Secondary | ICD-10-CM

## 2024-01-28 DIAGNOSIS — M25641 Stiffness of right hand, not elsewhere classified: Secondary | ICD-10-CM

## 2024-01-28 DIAGNOSIS — M65342 Trigger finger, left ring finger: Secondary | ICD-10-CM

## 2024-01-28 DIAGNOSIS — M6281 Muscle weakness (generalized): Secondary | ICD-10-CM

## 2024-01-28 DIAGNOSIS — M65321 Trigger finger, right index finger: Secondary | ICD-10-CM

## 2024-01-28 NOTE — Therapy (Signed)
 OUTPATIENT OCCUPATIONAL THERAPY ORTHO TREATMENT  Patient Name: Kara Wood MRN: [id number] DOB:10/11/1945, 78 y.o., female Today's Date: 01/28/2024  PCP: Dr Claudius Cumins REFERRING PROVIDER: Dr Mozell Arias  END OF SESSION:  OT End of Session - 01/28/24 0900     Visit Number 3    Number of Visits 10    Date for OT Re-Evaluation 03/07/24    OT Start Time 0900    OT Stop Time 0955    OT Time Calculation (min) 55 min    Activity Tolerance Patient tolerated treatment well    Behavior During Therapy [facility] for tasks assessed/performed          Past Medical History:  Diagnosis Date   Arthritis    Diabetes (HCC)    High cholesterol    Hypertension    Neuropathy in diabetes (HCC)    Stage 3 chronic kidney disease (HCC)    Past Surgical History:  Procedure Laterality Date   BACK SURGERY     CATARACT EXTRACTION W/PHACO Right 12/17/2014   Procedure: CATARACT EXTRACTION PHACO AND INTRAOCULAR LENS PLACEMENT (IOC);  Surgeon: Steven Dingeldein, MD;  Location: [facility] ORS;  Service: Ophthalmology;  Laterality: Right;  US  01:17 AP% 26.0 CDE 33.35   CHOLECYSTECTOMY     COLONOSCOPY WITH PROPOFOL  N/A 10/14/2023   Procedure: COLONOSCOPY WITH PROPOFOL ;  Surgeon: Quintin Buckle, DO;  Location: [facility] ENDOSCOPY;  Service: Gastroenterology;  Laterality: N/A;  DM   ESOPHAGOGASTRODUODENOSCOPY (EGD) WITH PROPOFOL  N/A 10/14/2023   Procedure: ESOPHAGOGASTRODUODENOSCOPY (EGD) WITH PROPOFOL ;  Surgeon: Quintin Buckle, DO;  Location: [facility] ENDOSCOPY;  Service: Gastroenterology;  Laterality: N/A;   POLYPECTOMY  10/14/2023   Procedure: POLYPECTOMY;  Surgeon: Quintin Buckle, DO;  Location: [facility] ENDOSCOPY;  Service: Gastroenterology;;   There are no active problems to display for this patient.   ONSET DATE: 6 months  REFERRING DIAG: Trigger fingers in bilateral hands   THERAPY DIAG:  Trigger index finger of left hand  Trigger finger, left ring finger  Trigger finger, right index finger  Stiffness of  joints of both hands  Muscle weakness (generalized)  Rationale for Evaluation and Treatment: Rehabilitation  SUBJECTIVE:   SUBJECTIVE STATEMENT: I put one of the splints to the Velcro Coming off.  The Right Index Finger Does Not Hurt Anymore Just Wants to [facility].  In the Left Ring Finger and Middle Finger Still Hurts.  And Lock at Times Mornings Is the Worst When I Try and Brush My Hair Pt accompanied by: self  PERTINENT HISTORY: 12/20/23 Dr Mozell Arias Note - Assessment/Plan:   Assessment & Plan Trigger finger  She has bilateral index finger trigger finger, with the left worse than the right, and the left ring finger is also affected but cannot flex to lock. Hyperglycemia likely exacerbates the condition. There are concerns about complications from corticosteroid injections due to elevated A1c levels (8.2). Informed consent covered risks of injections, especially with diabetes, and the potential need for surgery if conservative measures fail. Refer to hand therapy for bilateral index and left ring finger trigger finger. Use Voltaren gel topically on affected fingers three to four times a day. Avoid oral NSAIDs due to potential kidney impact. Re-evaluate after a few weeks of hand therapy; consider corticosteroid injection if no improvement. Discuss potential need for surgery if conservative measures fail and blood sugar levels improve.  Type 2 diabetes mellitus with hyperglycemia  She has type 2 diabetes mellitus with a recent A1c of 8.2, indicating poor glycemic control. Current medications include metformin, pioglitazone (Actos),  and Lantus insulin. Hyperglycemia is a concern for potential complications with trigger finger treatment. Encourage strict glycemic control to potentially improve trigger finger symptoms and reduce risks associated with corticosteroid injections. Diagnoses and all orders for this visit:  Trigger index finger of left hand - Ambulatory Referral to Occupational  Therapy  Trigger index finger of right hand - Ambulatory Referral to Occupational Therapy  Trigger ring finger of left hand - Ambulatory Referral to Occupational Therapy   PRECAUTIONS: None    WEIGHT BEARING RESTRICTIONS: No  PAIN:  Are you having pain? 4-5/10 pain hands or A1 pulleys L 4th   FALLS: Has patient fallen in last 6 months? No  LIVING ENVIRONMENT: Lives with: lives with their spouse   PLOF: Independent in basic ADLs and lighthouse activities. Patient mostly watching TV and on her phone.  And read a lot.  Do some laundry and cooking  PATIENT GOALS: Wants to get my trigger fingers better and avoid surgery.  NEXT MD VISIT:  4 wk after OT   OBJECTIVE:  Note: Objective measures were completed at Evaluation unless otherwise noted.  HAND DOMINANCE: Left  ADLs: Patient report increase of dropping objects.  As well as when gripping with fitting sheet or laundry or cutting food increased pain as well as locking of fingers in bilateral hands with the left worse than the right.   FUNCTIONAL OUTCOME MEASURES: Reassess next session  UPPER EXTREMITY ROM:   Wrist active range of motion in forearm within normal limits pain-free  Active ROM Right eval Left eval  Thumb MCP (0-60)    Thumb IP (0-80)    Thumb Radial abd/add (0-55)     Thumb Palmar abd/add (0-45)     Thumb Opposition to Small Finger     Index MCP (0-90) 80 locking in am  80  locking every time  Index PIP (0-100) 100  90  Index DIP (0-70)      Long MCP (0-90)  85  80 locked this am   Long PIP (0-100)  100  90  Long DIP (0-70)      Ring MCP (0-90)  85  80 increase edema   Ring PIP (0-100)  100  90  Ring DIP (0-70)      Little MCP (0-90)  90  80  Little PIP (0-100)  100  90  Little DIP (0-70)      (Blank rows = not tested) Patient with bilateral thumb CMC pain.  HAND FUNCTION: Grip strength: Right: 40 lbs; Left: 21 lbs, Lateral pinch: Right: 6 lbs, Left: 5 lbs, and 3 point pinch: Right: 5 lbs,  Left: 6 lbs pain in the palm of bilateral hands left worse than the right  COORDINATION: Within functional limits have a hard time because of her thumbs with buttons and zippers and small objects  SENSATION: Patient wears a wrist brace at nighttime on the left hand report numbness at night if not wearing the brace  EDEMA: Left fourth digit  COGNITION: Overall cognitive status: Within functional limits for tasks assessed      TREATMENT DATE: 01/28/24    Patient presents with decreased edema decreased pain in the left fourth digit. Continue to have trigger fingers of bilateral index as well as fourth digit on the left. Decreased range of motion mostly in the left 2nd and 4th digit. Patient reports wearing the [facility] block splint on the left  and R at nighttime.  Reviewed with patient again contrast use especially in the morning and  then maybe 2 more times to decrease edema and pain and increased motion prior to home exercises. Fluidotherapy Time: 8 2 rotations of ice of 1 minute between decrease inflammation Location: bilateral hands  Decrease stiffness and pain prior to review of TherEX and manual therapy                                                                                                                      Patient to perform 2-3 times a day contrast to decrease edema and pain and inflammation in bilateral hands. Patient to continue to wear [facility] block splint for the right and left second digits at nighttime as well as during the day with any gripping or composite flexion activities that she cannot modify  MC block splint is for patient to wear to prevent triggering or locking. Patient was educated in use of splints.  As well as wearing in donning and doffing. Patient verbalized understanding.  Remind patient again that she can do  ice massage over the A1 pulleys of bilateral hands that is tender during the day several times.  To decrease tenderness and pain and  inflammation.  Prior to range of motion done Graston tool #2 for gentle brushing over volar palm and digits prior to range of motion to increase blood flow decrease pain and increased motion. Patient to do after contrast MCP flexion with PIP extension 10 reps Than review and reinforce passive range of motion pain-free DIP/PIP flexion pain-free Patient can follow-up by gentle passive range of motion composite flexion pain-free.  Modifications and joint protection again done with patient  especially in the kitchen activities Using a phone as well as reading. Patient to use larger joints forearm and palms to carry and lift objects As well as to avoid tight sustained grips enlarging her grips. Reviewed some of patient's activities during the day.  Clinic using the remote, phone and reading.  As well as cooking and laundry.  Iontophoresis with dexamethasone done small patch over left fourth A1 pulley 1.6 current patient tolerated well  -remove patch to do skin check.  - had 2 tiny little blisters.  Patient educated on it and should disappear in 12 to 24 hours.  PATIENT EDUCATION: Education details: findings of eval and HEP  Person educated: Patient Education method: Explanation, Demonstration, Tactile cues, Verbal cues, and Handouts Education comprehension: verbalized understanding, returned demonstration, verbal cues required, and needs further education     GOALS: Goals reviewed with patient? Yes  LONG TERM GOALS: Target date: 8 wks   Patient be independent home program of wearing splint as well as home exercises for modalities and modifications to decrease pain in the left hand less than 2/10. Baseline: Patient with pain 6/10 with tenderness over the A1 pulleys of the 2nd and 4th.  Increased edema at the fourth locking every attempt of flexion of the second.  Morning locking on the 3rd and 4th. Goal status: INITIAL  2.  Patient to verbalize decrease triggering of locking of  bilateral  hands to less than 3 times a day. Baseline: Left hand triggering in the morning on the 3rd and 4th.  Second every attempt of flexion.  Right index finger locking in the morning. Goal status: INITIAL  3.  Patient to verbalize 3-4 modifications or joint protection that she implemented to decrease triggering or locking of bilateral hands as well as decreasing pain Baseline: Patient no knowledge of modifications and joint protection. Goal status: INITIAL  4.  Left hand digit flexion improved to within normal limits pain-free and symptom-free. Baseline: Left MC flexion 80 degrees and PIP is 90 degrees.  With tenderness and pain 6/10. Goal status: INITIAL  5.  Right grip strength improved with more than 5 pounds for patient to be able to use left hand with less triggering and brushing her teeth, using a knife, using her hands and bathing and dressing. Baseline:  Goal status: INITIAL ASSESSMENT:  CLINICAL IMPRESSION: Patient seen today for occupational therapy evaluation for bilateral hands with trigger fingers with the left worse than the right.  With a left index finger worse in the right index finger.  With increased edema in the 3rd and 4th digits with triggering at the the morning.  Patient pain 6/10 over the A1 pulleys.  Patient with decreased grip strength on the left more than the right.  With decreased prehension strength.  Patient is left-hand dominant.   NOW patient to continue to wear right and left MC block splint for second digits   Patient with decreased edema and pain in left fourth digit.  Reviewed with patient gentle passive range of motion for  MC flexion and PIP extention - DIP/ PIP flexion followed by passive range of motion composite pain-free.  Reinforced with patient again splinting use as well as home program and modifications.  Reinforced with patient to use palms in large her grips-to avoid any triggering.  Iontophoresis with dexamethasone initiated today over left fourth A1  pulley tolerated well.  Patient limited in functional use of bilateral hands in ADLs IADLs because of triggering.  Increased pain, increased edema and decreased motion in the left more than the right with decreased strength.  Patient can benefit from skilled OT services to decrease pain, edema and triggering and increased motion and strength to return to prior level of function to avoid having the shots or surgery.  Patient is diabetic.  PERFORMANCE DEFICITS: in functional skills including ADLs, IADLs, ROM, strength, pain, flexibility, decreased knowledge of use of DME, and UE functional use,   and psychosocial skills including environmental adaptation and routines and behaviors.   IMPAIRMENTS: are limiting patient from ADLs, IADLs, rest and sleep, play, leisure, and social participation.   COMORBIDITIES: has no other co-morbidities that affects occupational performance. Patient will benefit from skilled OT to address above impairments and improve overall function.  MODIFICATION OR ASSISTANCE TO COMPLETE EVALUATION: No modification of tasks or assist necessary to complete an evaluation.  OT OCCUPATIONAL PROFILE AND HISTORY: Problem focused assessment: Including review of records relating to presenting problem.  CLINICAL DECISION MAKING: LOW - limited treatment options, no task modification necessary  REHAB POTENTIAL: Good for goals  EVALUATION COMPLEXITY: Low      PLAN:  OT FREQUENCY: 1-2x/week  OT DURATION: 8 wks   PLANNED INTERVENTIONS: 97168 OT Re-evaluation, 97535 self care/ADL training, 16109 therapeutic exercise, 97530 therapeutic activity, 97112 neuromuscular re-education, 97140 manual therapy, 97035 ultrasound, 97018 paraffin, 60454 fluidotherapy, 97034 contrast bath, 97033 iontophoresis, 97760 Orthotic Initial, [id number] Orthotic/Prosthetic subsequent, patient/family education,  and DME and/or AE instructions    CONSULTED AND AGREED WITH PLAN OF CARE: Patient     Heloise Lobo, OTR/L,CLT 01/28/2024, 2:29 PM

## 2024-01-31 ENCOUNTER — Ambulatory Visit: Admitting: Occupational Therapy

## 2024-01-31 DIAGNOSIS — M65322 Trigger finger, left index finger: Secondary | ICD-10-CM

## 2024-01-31 DIAGNOSIS — M6281 Muscle weakness (generalized): Secondary | ICD-10-CM

## 2024-01-31 DIAGNOSIS — M65342 Trigger finger, left ring finger: Secondary | ICD-10-CM

## 2024-01-31 DIAGNOSIS — M65321 Trigger finger, right index finger: Secondary | ICD-10-CM

## 2024-01-31 DIAGNOSIS — M25641 Stiffness of right hand, not elsewhere classified: Secondary | ICD-10-CM

## 2024-01-31 NOTE — Therapy (Signed)
 OUTPATIENT OCCUPATIONAL THERAPY ORTHO TREATMENT  Patient Name: Kara Wood MRN: [id number] DOB:04/11/1946, 78 y.o., female Today's Date: 01/31/2024  PCP: Dr Auston REFERRING PROVIDER: Dr Kathlynn  END OF SESSION:  OT End of Session - 01/31/24 1507     Visit Number 4    Number of Visits 10    Date for OT Re-Evaluation 03/07/24    OT Start Time 1507    OT Stop Time 1608    OT Time Calculation (min) 61 min    Activity Tolerance Patient tolerated treatment well    Behavior During Therapy WFL for tasks assessed/performed          Past Medical History:  Diagnosis Date   Arthritis    Diabetes (HCC)    High cholesterol    Hypertension    Neuropathy in diabetes (HCC)    Stage 3 chronic kidney disease (HCC)    Past Surgical History:  Procedure Laterality Date   BACK SURGERY     CATARACT EXTRACTION W/PHACO Right 12/17/2014   Procedure: CATARACT EXTRACTION PHACO AND INTRAOCULAR LENS PLACEMENT (IOC);  Surgeon: Steven Dingeldein, MD;  Location: [facility] ORS;  Service: Ophthalmology;  Laterality: Right;  US  01:17 AP% 26.0 CDE 33.35   CHOLECYSTECTOMY     COLONOSCOPY WITH PROPOFOL  N/A 10/14/2023   Procedure: COLONOSCOPY WITH PROPOFOL ;  Surgeon: Onita Elspeth Sharper, DO;  Location: [facility] ENDOSCOPY;  Service: Gastroenterology;  Laterality: N/A;  DM   ESOPHAGOGASTRODUODENOSCOPY (EGD) WITH PROPOFOL  N/A 10/14/2023   Procedure: ESOPHAGOGASTRODUODENOSCOPY (EGD) WITH PROPOFOL ;  Surgeon: Onita Elspeth Sharper, DO;  Location: [facility] ENDOSCOPY;  Service: Gastroenterology;  Laterality: N/A;   POLYPECTOMY  10/14/2023   Procedure: POLYPECTOMY;  Surgeon: Onita Elspeth Sharper, DO;  Location: [facility] ENDOSCOPY;  Service: Gastroenterology;;   There are no active problems to display for this patient.   ONSET DATE: 6 months  REFERRING DIAG: Trigger fingers in bilateral hands   THERAPY DIAG:  Trigger index finger of left hand  Trigger finger, left ring finger  Trigger finger, right index finger  Stiffness of  joints of both hands  Muscle weakness (generalized)  Rationale for Evaluation and Treatment: Rehabilitation  SUBJECTIVE:   SUBJECTIVE STATEMENT: Inflammation is better in my fourth finger.  The pain is better to- I think wearing the splints at night.  My right  index finger locks the most.  But not painful. Pt accompanied by: self  PERTINENT HISTORY: 12/20/23 Dr Kathlynn Note - Assessment/Plan:   Assessment & Plan Trigger finger  She has bilateral index finger trigger finger, with the left worse than the right, and the left ring finger is also affected but cannot flex to lock. Hyperglycemia likely exacerbates the condition. There are concerns about complications from corticosteroid injections due to elevated A1c levels (8.2). Informed consent covered risks of injections, especially with diabetes, and the potential need for surgery if conservative measures fail. Refer to hand therapy for bilateral index and left ring finger trigger finger. Use Voltaren gel topically on affected fingers three to four times a day. Avoid oral NSAIDs due to potential kidney impact. Re-evaluate after a few weeks of hand therapy; consider corticosteroid injection if no improvement. Discuss potential need for surgery if conservative measures fail and blood sugar levels improve.  Type 2 diabetes mellitus with hyperglycemia  She has type 2 diabetes mellitus with a recent A1c of 8.2, indicating poor glycemic control. Current medications include metformin, pioglitazone (Actos), and Lantus insulin. Hyperglycemia is a concern for potential complications with trigger finger treatment. Encourage strict glycemic control  to potentially improve trigger finger symptoms and reduce risks associated with corticosteroid injections. Diagnoses and all orders for this visit:  Trigger index finger of left hand - Ambulatory Referral to Occupational Therapy  Trigger index finger of right hand - Ambulatory Referral to Occupational  Therapy  Trigger ring finger of left hand - Ambulatory Referral to Occupational Therapy   PRECAUTIONS: None    WEIGHT BEARING RESTRICTIONS: No  PAIN:  Are you having pain? 2/10 pain hands or A1 pulleys L 4th   FALLS: Has patient fallen in last 6 months? No  LIVING ENVIRONMENT: Lives with: lives with their spouse   PLOF: Independent in basic ADLs and lighthouse activities. Patient mostly watching TV and on her phone.  And read a lot.  Do some laundry and cooking  PATIENT GOALS: Wants to get my trigger fingers better and avoid surgery.  NEXT MD VISIT:  4 wk after OT   OBJECTIVE:  Note: Objective measures were completed at Evaluation unless otherwise noted.  HAND DOMINANCE: Left  ADLs: Patient report increase of dropping objects.  As well as when gripping with fitting sheet or laundry or cutting food increased pain as well as locking of fingers in bilateral hands with the left worse than the right.   FUNCTIONAL OUTCOME MEASURES: Reassess next session  UPPER EXTREMITY ROM:   Wrist active range of motion in forearm within normal limits pain-free  Active ROM Right eval Left eval  Thumb MCP (0-60)    Thumb IP (0-80)    Thumb Radial abd/add (0-55)     Thumb Palmar abd/add (0-45)     Thumb Opposition to Small Finger     Index MCP (0-90) 80 locking in am  80  locking every time  Index PIP (0-100) 100  90  Index DIP (0-70)      Long MCP (0-90)  85  80 locked this am   Long PIP (0-100)  100  90  Long DIP (0-70)      Ring MCP (0-90)  85  80 increase edema   Ring PIP (0-100)  100  90  Ring DIP (0-70)      Little MCP (0-90)  90  80  Little PIP (0-100)  100  90  Little DIP (0-70)      (Blank rows = not tested) Patient with bilateral thumb CMC pain.  HAND FUNCTION: Grip strength: Right: 40 lbs; Left: 21 lbs, Lateral pinch: Right: 6 lbs, Left: 5 lbs, and 3 point pinch: Right: 5 lbs, Left: 6 lbs pain in the palm of bilateral hands left worse than the  right  COORDINATION: Within functional limits have a hard time because of her thumbs with buttons and zippers and small objects  SENSATION: Patient wears a wrist brace at nighttime on the left hand report numbness at night if not wearing the brace  EDEMA: Left fourth digit  COGNITION: Overall cognitive status: Within functional limits for tasks assessed      TREATMENT DATE: 01/31/24    Patient presents with decreased edema decreased pain in the left fourth digit. Continue to have trigger fingers of bilateral index as well as fourth digit on the left. Patient with a Band-Aid on the left index finger DIP.  Hold off on any range of motion. Passive range of motion on left fourth digit improved. PROM right second digit within normal limits Patient reports wearing the [facility] block splint on the left  and R at nighttime.  Reviewed with patient again contrast use especially  in the morning and then maybe 2 more times to decrease edema and pain and increased motion prior to home exercises. Contrast today - pt with bandaid on L 2nd DIP Time: 8 with  2 rotations of ice of 1 minute between decrease inflammation Location: bilateral hands  Decrease stiffness and pain prior to review of TherEX and manual therapy                                                                                                                       Patient to continue to wear MC block splint for the right and left second digits at nighttime as well as during the day with any gripping or composite flexion activities that she cannot modify  MC block splint is for patient to wear to prevent triggering or locking. Patient was educated in use of splints.  As well as wearing in donning and doffing. Patient verbalized understanding. Explained to patient she cannot wear full finger splint because it would decrease and immobilize 20 joints  Remind patient again that she can do  ice massage over the A1 pulleys of bilateral  hands that is tender during the day several times.  To decrease tenderness and pain and inflammation.  Prior to range of motion done Graston tool #2 for gentle brushing over volar palm and digits prior to range of motion to increase blood flow decrease pain and increased motion. Patient to do after contrast MCP flexion with PIP extension 10 reps Than review and reinforce passive range of motion pain-free DIP/PIP flexion pain-free Patient can follow-up by gentle passive range of motion composite flexion pain-free.  Modifications and joint protection again done with patient  especially in the kitchen activities Using a phone as well as reading. Patient to use larger joints forearm and palms to carry and lift objects As well as to avoid tight sustained grips enlarging her grips. Reviewed some of patient's activities during the day.  Clinic using the remote, phone and reading.  As well as cooking and laundry.  Iontophoresis with dexamethasone done small patch over left fourth A1 pulley and right second 1.7 on L and R 2.0 current patient tolerated well  -remove patch to do skin check.  PATIENT EDUCATION: Education details: findings of eval and HEP  Person educated: Patient Education method: Explanation, Demonstration, Tactile cues, Verbal cues, and Handouts Education comprehension: verbalized understanding, returned demonstration, verbal cues required, and needs further education     GOALS: Goals reviewed with patient? Yes  LONG TERM GOALS: Target date: 8 wks   Patient be independent home program of wearing splint as well as home exercises for modalities and modifications to decrease pain in the left hand less than 2/10. Baseline: Patient with pain 6/10 with tenderness over the A1 pulleys of the 2nd and 4th.  Increased edema at the fourth locking every attempt of flexion of the second.  Morning locking on the 3rd and 4th. Goal status: INITIAL  2.  Patient to verbalize decrease triggering  of  locking of bilateral hands to less than 3 times a day. Baseline: Left hand triggering in the morning on the 3rd and 4th.  Second every attempt of flexion.  Right index finger locking in the morning. Goal status: INITIAL  3.  Patient to verbalize 3-4 modifications or joint protection that she implemented to decrease triggering or locking of bilateral hands as well as decreasing pain Baseline: Patient no knowledge of modifications and joint protection. Goal status: INITIAL  4.  Left hand digit flexion improved to within normal limits pain-free and symptom-free. Baseline: Left MC flexion 80 degrees and PIP is 90 degrees.  With tenderness and pain 6/10. Goal status: INITIAL  5.  Right grip strength improved with more than 5 pounds for patient to be able to use left hand with less triggering and brushing her teeth, using a knife, using her hands and bathing and dressing. Baseline:  Goal status: INITIAL ASSESSMENT:  CLINICAL IMPRESSION: Patient seen today for occupational therapy evaluation for bilateral hands with trigger fingers with the left worse than the right.  With a left index finger worse in the right index finger.  With increased edema in the 3rd and 4th digits with triggering at the the morning.  Patient pain 6/10 over the A1 pulleys.  Patient with decreased grip strength on the left more than the right.  With decreased prehension strength.  Patient is left-hand dominant.   NOW patient to continue to wear right and left MC block splint for second digits   Patient with decreased edema and pain in left fourth digit.  Increased passive range of motion this date reviewed with patient gentle passive range of motion for  MC flexion and PIP extention - DIP/ PIP flexion followed by passive range of motion composite pain-free.  Reinforced with patient again splinting use as well as home program and modifications.  Reinforced with patient to use palms in large her grips-to avoid any triggering.   Iontophoresis with dexamethasone for left fourth and R 2nd A1 pulley tolerated well.  Patient limited in functional use of bilateral hands in ADLs IADLs because of triggering.  Increased pain, increased edema and decreased motion in the left more than the right with decreased strength.  Patient can benefit from skilled OT services to decrease pain, edema and triggering and increased motion and strength to return to prior level of function to avoid having the shots or surgery.  Patient is diabetic.  PERFORMANCE DEFICITS: in functional skills including ADLs, IADLs, ROM, strength, pain, flexibility, decreased knowledge of use of DME, and UE functional use,   and psychosocial skills including environmental adaptation and routines and behaviors.   IMPAIRMENTS: are limiting patient from ADLs, IADLs, rest and sleep, play, leisure, and social participation.   COMORBIDITIES: has no other co-morbidities that affects occupational performance. Patient will benefit from skilled OT to address above impairments and improve overall function.  MODIFICATION OR ASSISTANCE TO COMPLETE EVALUATION: No modification of tasks or assist necessary to complete an evaluation.  OT OCCUPATIONAL PROFILE AND HISTORY: Problem focused assessment: Including review of records relating to presenting problem.  CLINICAL DECISION MAKING: LOW - limited treatment options, no task modification necessary  REHAB POTENTIAL: Good for goals  EVALUATION COMPLEXITY: Low      PLAN:  OT FREQUENCY: 1-2x/week  OT DURATION: 8 wks   PLANNED INTERVENTIONS: 97168 OT Re-evaluation, 97535 self care/ADL training, 02889 therapeutic exercise, 97530 therapeutic activity, 97112 neuromuscular re-education, 97140 manual therapy, 97035 ultrasound, 97018 paraffin, 02960 fluidotherapy, 97034 contrast  bath, [id number] iontophoresis, [id number] Orthotic Initial, 02236 Orthotic/Prosthetic subsequent, patient/family education, and DME and/or AE  instructions    CONSULTED AND AGREED WITH PLAN OF CARE: Patient     Ancel Peters, OTR/L,CLT 01/31/2024, 3:53 PM

## 2024-02-03 ENCOUNTER — Ambulatory Visit: Admitting: Occupational Therapy

## 2024-02-03 DIAGNOSIS — M6281 Muscle weakness (generalized): Secondary | ICD-10-CM

## 2024-02-03 DIAGNOSIS — M65322 Trigger finger, left index finger: Secondary | ICD-10-CM | POA: Diagnosis not present

## 2024-02-03 DIAGNOSIS — M25641 Stiffness of right hand, not elsewhere classified: Secondary | ICD-10-CM

## 2024-02-03 DIAGNOSIS — M65342 Trigger finger, left ring finger: Secondary | ICD-10-CM

## 2024-02-03 DIAGNOSIS — M65321 Trigger finger, right index finger: Secondary | ICD-10-CM

## 2024-02-03 NOTE — Therapy (Signed)
 OUTPATIENT OCCUPATIONAL THERAPY ORTHO TREATMENT  Patient Name: Kara Wood MRN: [id number] DOB:11/23/1945, 78 y.o., female Today's Date: 02/03/2024  PCP: Dr Auston REFERRING PROVIDER: Dr Kathlynn  END OF SESSION:  OT End of Session - 02/03/24 1534     Visit Number 5    Number of Visits 10    Date for OT Re-Evaluation 03/07/24    OT Start Time 1534    OT Stop Time 1624    OT Time Calculation (min) 50 min    Activity Tolerance Patient tolerated treatment well    Behavior During Therapy WFL for tasks assessed/performed          Past Medical History:  Diagnosis Date   Arthritis    Diabetes (HCC)    High cholesterol    Hypertension    Neuropathy in diabetes (HCC)    Stage 3 chronic kidney disease (HCC)    Past Surgical History:  Procedure Laterality Date   BACK SURGERY     CATARACT EXTRACTION W/PHACO Right 12/17/2014   Procedure: CATARACT EXTRACTION PHACO AND INTRAOCULAR LENS PLACEMENT (IOC);  Surgeon: Steven Dingeldein, MD;  Location: [facility] ORS;  Service: Ophthalmology;  Laterality: Right;  US  01:17 AP% 26.0 CDE 33.35   CHOLECYSTECTOMY     COLONOSCOPY WITH PROPOFOL  N/A 10/14/2023   Procedure: COLONOSCOPY WITH PROPOFOL ;  Surgeon: Onita Elspeth Sharper, DO;  Location: [facility] ENDOSCOPY;  Service: Gastroenterology;  Laterality: N/A;  DM   ESOPHAGOGASTRODUODENOSCOPY (EGD) WITH PROPOFOL  N/A 10/14/2023   Procedure: ESOPHAGOGASTRODUODENOSCOPY (EGD) WITH PROPOFOL ;  Surgeon: Onita Elspeth Sharper, DO;  Location: [facility] ENDOSCOPY;  Service: Gastroenterology;  Laterality: N/A;   POLYPECTOMY  10/14/2023   Procedure: POLYPECTOMY;  Surgeon: Onita Elspeth Sharper, DO;  Location: [facility] ENDOSCOPY;  Service: Gastroenterology;;   There are no active problems to display for this patient.   ONSET DATE: 6 months  REFERRING DIAG: Trigger fingers in bilateral hands   THERAPY DIAG:  Trigger index finger of left hand  Trigger finger, left ring finger  Trigger finger, right index finger  Stiffness of  joints of both hands  Muscle weakness (generalized)  Rationale for Evaluation and Treatment: Rehabilitation  SUBJECTIVE:   SUBJECTIVE STATEMENT: I had to clean some at home - so my finger  is hurting more   Pt accompanied by: self  PERTINENT HISTORY: 12/20/23 Dr Kathlynn Note - Assessment/Plan:   Assessment & Plan Trigger finger  She has bilateral index finger trigger finger, with the left worse than the right, and the left ring finger is also affected but cannot flex to lock. Hyperglycemia likely exacerbates the condition. There are concerns about complications from corticosteroid injections due to elevated A1c levels (8.2). Informed consent covered risks of injections, especially with diabetes, and the potential need for surgery if conservative measures fail. Refer to hand therapy for bilateral index and left ring finger trigger finger. Use Voltaren gel topically on affected fingers three to four times a day. Avoid oral NSAIDs due to potential kidney impact. Re-evaluate after a few weeks of hand therapy; consider corticosteroid injection if no improvement. Discuss potential need for surgery if conservative measures fail and blood sugar levels improve.  Type 2 diabetes mellitus with hyperglycemia  She has type 2 diabetes mellitus with a recent A1c of 8.2, indicating poor glycemic control. Current medications include metformin, pioglitazone (Actos), and Lantus insulin. Hyperglycemia is a concern for potential complications with trigger finger treatment. Encourage strict glycemic control to potentially improve trigger finger symptoms and reduce risks associated with corticosteroid injections. Diagnoses and all  orders for this visit:  Trigger index finger of left hand - Ambulatory Referral to Occupational Therapy  Trigger index finger of right hand - Ambulatory Referral to Occupational Therapy  Trigger ring finger of left hand - Ambulatory Referral to Occupational Therapy   PRECAUTIONS:  None    WEIGHT BEARING RESTRICTIONS: No  PAIN:  Are you having pain? 4/10 pain hands or A1 pulleys L 4th   FALLS: Has patient fallen in last 6 months? No  LIVING ENVIRONMENT: Lives with: lives with their spouse   PLOF: Independent in basic ADLs and lighthouse activities. Patient mostly watching TV and on her phone.  And read a lot.  Do some laundry and cooking  PATIENT GOALS: Wants to get my trigger fingers better and avoid surgery.  NEXT MD VISIT:  4 wk after OT   OBJECTIVE:  Note: Objective measures were completed at Evaluation unless otherwise noted.  HAND DOMINANCE: Left  ADLs: Patient report increase of dropping objects.  As well as when gripping with fitting sheet or laundry or cutting food increased pain as well as locking of fingers in bilateral hands with the left worse than the right.   FUNCTIONAL OUTCOME MEASURES: Reassess next session  UPPER EXTREMITY ROM:   Wrist active range of motion in forearm within normal limits pain-free  Active ROM Right eval Left eval  Thumb MCP (0-60)    Thumb IP (0-80)    Thumb Radial abd/add (0-55)     Thumb Palmar abd/add (0-45)     Thumb Opposition to Small Finger     Index MCP (0-90) 80 locking in am  80  locking every time  Index PIP (0-100) 100  90  Index DIP (0-70)      Long MCP (0-90)  85  80 locked this am   Long PIP (0-100)  100  90  Long DIP (0-70)      Ring MCP (0-90)  85  80 increase edema   Ring PIP (0-100)  100  90  Ring DIP (0-70)      Little MCP (0-90)  90  80  Little PIP (0-100)  100  90  Little DIP (0-70)      (Blank rows = not tested) Patient with bilateral thumb CMC pain.  HAND FUNCTION: Grip strength: Right: 40 lbs; Left: 21 lbs, Lateral pinch: Right: 6 lbs, Left: 5 lbs, and 3 point pinch: Right: 5 lbs, Left: 6 lbs pain in the palm of bilateral hands left worse than the right  COORDINATION: Within functional limits have a hard time because of her thumbs with buttons and zippers and small  objects  SENSATION: Patient wears a wrist brace at nighttime on the left hand report numbness at night if not wearing the brace  EDEMA: Left fourth digit  COGNITION: Overall cognitive status: Within functional limits for tasks assessed      TREATMENT DATE: 02/03/24    Patient presents with decreased edema in left fourth digit but tenderness over the A1 pulley increased after patient had to do some cleaning at home.   Continue to have trigger fingers right index more than left index as well as fourth digit on the left. PROM right second digit within normal limits Patient reports wearing the [facility] block splint on the left fourth and R index at nighttime.  Reviewed with patient again contrast use especially in the morning and then maybe 2 more times to decrease edema and pain and increased motion prior to home exercises. Contrast today -  Time: 8 with  2 rotations of ice of 1 minute between decrease inflammation Location: bilateral hands  Decrease stiffness and pain prior to review of TherEX and manual therapy                                                                                                                       Patient to continue to wear MC block splint for the right and left second digits at nighttime as well as during the day with any gripping or composite flexion activities that she cannot modify  MC block splint is for patient to wear to prevent triggering or locking. Patient was educated in use of splints.  As well as wearing in donning and doffing. Patient verbalized understanding. Explained to patient she cannot wear full finger splint because it would decrease and immobilize 20 joints  Remind patient again that she can do  ice massage over the A1 pulleys of bilateral hands that is tender during the day several times.  To decrease tenderness and pain and inflammation.  Prior to range of motion done Graston tool #2 for gentle brushing over volar palm and digits  prior to range of motion to increase blood flow decrease pain and increased motion. Patient to do after contrast MCP flexion with PIP extension 10 reps Than review and reinforce passive range of motion pain-free DIP/PIP flexion pain-free Patient can follow-up by gentle passive range of motion composite flexion pain-free. Done this date by OT  Modifications and joint protection again done with patient  especially in the kitchen activities Using a phone as well as reading. Patient to use larger joints forearm and palms to carry and lift objects As well as to avoid tight sustained grips enlarging her grips. Reviewed some of patient's activities during the day.  Clinic using the remote, phone and reading.  As well as cooking and laundry.  Iontophoresis with dexamethasone done small patch over left fourth A1 pulley and right second 2.0 current - patient tolerated well  -patient to keep patch on for about an hour afterwards. PATIENT EDUCATION: Education details: findings of eval and HEP  Person educated: Patient Education method: Explanation, Demonstration, Tactile cues, Verbal cues, and Handouts Education comprehension: verbalized understanding, returned demonstration, verbal cues required, and needs further education     GOALS: Goals reviewed with patient? Yes  LONG TERM GOALS: Target date: 8 wks   Patient be independent home program of wearing splint as well as home exercises for modalities and modifications to decrease pain in the left hand less than 2/10. Baseline: Patient with pain 6/10 with tenderness over the A1 pulleys of the 2nd and 4th.  Increased edema at the fourth locking every attempt of flexion of the second.  Morning locking on the 3rd and 4th. Goal status: INITIAL  2.  Patient to verbalize decrease triggering of locking of bilateral hands to less than 3 times a day. Baseline: Left hand triggering in the morning on the 3rd and 4th.  Second every  attempt of flexion.  Right  index finger locking in the morning. Goal status: INITIAL  3.  Patient to verbalize 3-4 modifications or joint protection that she implemented to decrease triggering or locking of bilateral hands as well as decreasing pain Baseline: Patient no knowledge of modifications and joint protection. Goal status: INITIAL  4.  Left hand digit flexion improved to within normal limits pain-free and symptom-free. Baseline: Left MC flexion 80 degrees and PIP is 90 degrees.  With tenderness and pain 6/10. Goal status: INITIAL  5.  Right grip strength improved with more than 5 pounds for patient to be able to use left hand with less triggering and brushing her teeth, using a knife, using her hands and bathing and dressing. Baseline:  Goal status: INITIAL ASSESSMENT:  CLINICAL IMPRESSION: Patient seen today for occupational therapy evaluation for bilateral hands with trigger fingers with the left worse than the right.  With a left index finger worse in the right index finger.  With increased edema in the 3rd and 4th digits with triggering at the the morning.  Patient pain 6/10 over the A1 pulleys.  Patient with decreased grip strength on the left more than the right.  With decreased prehension strength.  Patient is left-hand dominant.   NOW patient to continue to wear right second digit and left fourth digit MC block splint for second digits   Patient with decreased edema but pain improved because patient cleaned the house some in the left left fourth digit.  Increased passive range of motion reviewed with patient gentle passive range of motion for  MC flexion and PIP extention - DIP/ PIP flexion followed by passive range of motion composite pain-free.  Reinforced with patient again splinting use as well as home program and modifications.  Reinforced with patient to use palms in large her grips-to avoid any triggering.  Iontophoresis with dexamethasone for left fourth and R 2nd A1 pulley tolerated well.  Patient  limited in functional use of bilateral hands in ADLs IADLs because of triggering.  Increased pain, increased edema and decreased motion in the left more than the right with decreased strength.  Patient can benefit from skilled OT services to decrease pain, edema and triggering and increased motion and strength to return to prior level of function to avoid having the shots or surgery.  Patient is diabetic.  PERFORMANCE DEFICITS: in functional skills including ADLs, IADLs, ROM, strength, pain, flexibility, decreased knowledge of use of DME, and UE functional use,   and psychosocial skills including environmental adaptation and routines and behaviors.   IMPAIRMENTS: are limiting patient from ADLs, IADLs, rest and sleep, play, leisure, and social participation.   COMORBIDITIES: has no other co-morbidities that affects occupational performance. Patient will benefit from skilled OT to address above impairments and improve overall function.  MODIFICATION OR ASSISTANCE TO COMPLETE EVALUATION: No modification of tasks or assist necessary to complete an evaluation.  OT OCCUPATIONAL PROFILE AND HISTORY: Problem focused assessment: Including review of records relating to presenting problem.  CLINICAL DECISION MAKING: LOW - limited treatment options, no task modification necessary  REHAB POTENTIAL: Good for goals  EVALUATION COMPLEXITY: Low      PLAN:  OT FREQUENCY: 1-2x/week  OT DURATION: 8 wks   PLANNED INTERVENTIONS: 97168 OT Re-evaluation, 97535 self care/ADL training, 02889 therapeutic exercise, 97530 therapeutic activity, 97112 neuromuscular re-education, 97140 manual therapy, 97035 ultrasound, 97018 paraffin, 02960 fluidotherapy, 97034 contrast bath, 97033 iontophoresis, 97760 Orthotic Initial, [id number] Orthotic/Prosthetic subsequent, patient/family education, and DME and/or AE instructions  CONSULTED AND AGREED WITH PLAN OF CARE: Patient     Ancel Peters, OTR/L,CLT 02/03/2024, 5:09  PM

## 2024-02-08 ENCOUNTER — Ambulatory Visit: Attending: Orthopedic Surgery | Admitting: Occupational Therapy

## 2024-02-08 DIAGNOSIS — M65321 Trigger finger, right index finger: Secondary | ICD-10-CM | POA: Insufficient documentation

## 2024-02-08 DIAGNOSIS — M25642 Stiffness of left hand, not elsewhere classified: Secondary | ICD-10-CM | POA: Diagnosis present

## 2024-02-08 DIAGNOSIS — M65342 Trigger finger, left ring finger: Secondary | ICD-10-CM | POA: Insufficient documentation

## 2024-02-08 DIAGNOSIS — M65322 Trigger finger, left index finger: Secondary | ICD-10-CM | POA: Insufficient documentation

## 2024-02-08 DIAGNOSIS — M6281 Muscle weakness (generalized): Secondary | ICD-10-CM | POA: Diagnosis present

## 2024-02-08 DIAGNOSIS — M25641 Stiffness of right hand, not elsewhere classified: Secondary | ICD-10-CM | POA: Insufficient documentation

## 2024-02-08 NOTE — Therapy (Signed)
 OUTPATIENT OCCUPATIONAL THERAPY ORTHO TREATMENT  Patient Name: Kara Wood MRN: [id number] DOB:03/01/1946, 78 y.o., female Today's Date: 02/08/2024  PCP: Dr Auston REFERRING PROVIDER: Dr Kathlynn  END OF SESSION:  OT End of Session - 02/08/24 1302     Visit Number 6    Number of Visits 10    Date for OT Re-Evaluation 03/07/24    OT Start Time 1302    OT Stop Time 1400    OT Time Calculation (min) 58 min    Activity Tolerance Patient tolerated treatment well    Behavior During Therapy WFL for tasks assessed/performed           Past Medical History:  Diagnosis Date   Arthritis    Diabetes (HCC)    High cholesterol    Hypertension    Neuropathy in diabetes (HCC)    Stage 3 chronic kidney disease (HCC)    Past Surgical History:  Procedure Laterality Date   BACK SURGERY     CATARACT EXTRACTION W/PHACO Right 12/17/2014   Procedure: CATARACT EXTRACTION PHACO AND INTRAOCULAR LENS PLACEMENT (IOC);  Surgeon: Steven Dingeldein, MD;  Location: [facility] ORS;  Service: Ophthalmology;  Laterality: Right;  US  01:17 AP% 26.0 CDE 33.35   CHOLECYSTECTOMY     COLONOSCOPY WITH PROPOFOL  N/A 10/14/2023   Procedure: COLONOSCOPY WITH PROPOFOL ;  Surgeon: Onita Elspeth Sharper, DO;  Location: [facility] ENDOSCOPY;  Service: Gastroenterology;  Laterality: N/A;  DM   ESOPHAGOGASTRODUODENOSCOPY (EGD) WITH PROPOFOL  N/A 10/14/2023   Procedure: ESOPHAGOGASTRODUODENOSCOPY (EGD) WITH PROPOFOL ;  Surgeon: Onita Elspeth Sharper, DO;  Location: [facility] ENDOSCOPY;  Service: Gastroenterology;  Laterality: N/A;   POLYPECTOMY  10/14/2023   Procedure: POLYPECTOMY;  Surgeon: Onita Elspeth Sharper, DO;  Location: [facility] ENDOSCOPY;  Service: Gastroenterology;;   There are no active problems to display for this patient.   ONSET DATE: 6 months  REFERRING DIAG: Trigger fingers in bilateral hands   THERAPY DIAG:  Trigger index finger of left hand  Trigger finger, left ring finger  Trigger finger, right index finger  Stiffness of  joints of both hands  Muscle weakness (generalized)  Rationale for Evaluation and Treatment: Rehabilitation  SUBJECTIVE:   SUBJECTIVE STATEMENT: I can tell a difference.  The pain is better in my left hand.  Finger not swollen.  Get little more motion not as stiff.  The right index finger still locks at times Pt accompanied by: self  PERTINENT HISTORY: 12/20/23 Dr Kathlynn Note - Assessment/Plan:   Assessment & Plan Trigger finger  She has bilateral index finger trigger finger, with the left worse than the right, and the left ring finger is also affected but cannot flex to lock. Hyperglycemia likely exacerbates the condition. There are concerns about complications from corticosteroid injections due to elevated A1c levels (8.2). Informed consent covered risks of injections, especially with diabetes, and the potential need for surgery if conservative measures fail. Refer to hand therapy for bilateral index and left ring finger trigger finger. Use Voltaren gel topically on affected fingers three to four times a day. Avoid oral NSAIDs due to potential kidney impact. Re-evaluate after a few weeks of hand therapy; consider corticosteroid injection if no improvement. Discuss potential need for surgery if conservative measures fail and blood sugar levels improve.  Type 2 diabetes mellitus with hyperglycemia  She has type 2 diabetes mellitus with a recent A1c of 8.2, indicating poor glycemic control. Current medications include metformin, pioglitazone (Actos), and Lantus insulin. Hyperglycemia is a concern for potential complications with trigger finger treatment. Encourage  strict glycemic control to potentially improve trigger finger symptoms and reduce risks associated with corticosteroid injections. Diagnoses and all orders for this visit:  Trigger index finger of left hand - Ambulatory Referral to Occupational Therapy  Trigger index finger of right hand - Ambulatory Referral to Occupational  Therapy  Trigger ring finger of left hand - Ambulatory Referral to Occupational Therapy   PRECAUTIONS: None    WEIGHT BEARING RESTRICTIONS: No  PAIN:  Are you having pain? 2-3/10 pain A1 pulleys L 4th   FALLS: Has patient fallen in last 6 months? No  LIVING ENVIRONMENT: Lives with: lives with their spouse   PLOF: Independent in basic ADLs and lighthouse activities. Patient mostly watching TV and on her phone.  And read a lot.  Do some laundry and cooking  PATIENT GOALS: Wants to get my trigger fingers better and avoid surgery.  NEXT MD VISIT:  4 wk after OT   OBJECTIVE:  Note: Objective measures were completed at Evaluation unless otherwise noted.  HAND DOMINANCE: Left  ADLs: Patient report increase of dropping objects.  As well as when gripping with fitting sheet or laundry or cutting food increased pain as well as locking of fingers in bilateral hands with the left worse than the right.   FUNCTIONAL OUTCOME MEASURES: Reassess next session  UPPER EXTREMITY ROM:   Wrist active range of motion in forearm within normal limits pain-free  Active ROM Right eval Left eval R 02/08/24  Thumb MCP (0-60)     Thumb IP (0-80)     Thumb Radial abd/add (0-55)      Thumb Palmar abd/add (0-45)      Thumb Opposition to Small Finger      Index MCP (0-90) 80 locking in am  80  locking every time   Index PIP (0-100) 100  90   Index DIP (0-70)       Long MCP (0-90)  85  80 locked this am    Long PIP (0-100)  100  90   Long DIP (0-70)       Ring MCP (0-90)  85  80 increase edema  90 PROM  Ring PIP (0-100)  100  90 90 PROM   Ring DIP (0-70)     70 PROM  Little MCP (0-90)  90  80   Little PIP (0-100)  100  90   Little DIP (0-70)       (Blank rows = not tested) Patient with bilateral thumb CMC pain.  HAND FUNCTION: Grip strength: Right: 40 lbs; Left: 21 lbs, Lateral pinch: Right: 6 lbs, Left: 5 lbs, and 3 point pinch: Right: 5 lbs, Left: 6 lbs pain in the palm of bilateral hands  left worse than the right  COORDINATION: Within functional limits have a hard time because of her thumbs with buttons and zippers and small objects  SENSATION: Patient wears a wrist brace at nighttime on the left hand report numbness at night if not wearing the brace  EDEMA: Left fourth digit  COGNITION: Overall cognitive status: Within functional limits for tasks assessed      TREATMENT DATE: 02/08/24    Patient presents with decreased edema in left fourth digit  and tenderness over the A1 pulley  improving - no pain in hand    Continue to have trigger fingers right index more than left index - decrease stiffness in R 4th  PROM right second digit within normal limits Patient reports wearing the [facility] block splint on the left  fourth and R index at nighttime.  Reviewed with patient again contrast use especially in the morning and then maybe 2 more times to decrease edema and pain and increased motion prior to home exercises. Contrast today -  Time: 8 with  2 rotations of ice of 1 minute between decrease inflammation Location: bilateral hands  Decrease stiffness and pain prior to review of TherEX and manual therapy                                                                                                                       Patient to continue to wear MC block splint for the right and left second digits at nighttime as well as during the day with any gripping or composite flexion activities that she cannot modify  MC block splint is for patient to wear to prevent triggering or locking. Patient was educated in use of splints.  As well as wearing in donning and doffing. Patient verbalized understanding. Explained to patient she cannot wear full finger splint because it would decrease and immobilize 20 joints  Remind patient again that she can do  ice massage over the A1 pulleys of bilateral hands that is tender during the day several times.  To decrease tenderness and pain and  inflammation.  Prior to range of motion done Graston tool #2 for gentle brushing over volar palm and digits prior to range of motion to increase blood flow decrease pain and increased motion. Patient to do after contrast MCP flexion with PIP extension 10 reps Than review and reinforce passive range of motion pain-free DIP/PIP flexion pain-free   Increase to 90/90 and 70 at PIP  Patient can follow-up by gentle passive range of motion composite flexion pain-free. REINFORCE Done this date by OT  Modifications and joint protection again done with patient  especially in the kitchen activities Using a phone as well as reading. Patient to use larger joints forearm and palms to carry and lift objects As well as to avoid tight sustained grips enlarging her grips. Reviewed some of patient's activities during the day.  Clinic using the remote, phone and reading.  As well as cooking and laundry.  Iontophoresis with dexamethasone done small patch over left fourth A1 pulley and right second 2.0 current - patient tolerated well  -patient to keep patch on for about an hour afterwards. PATIENT EDUCATION: Education details: findings of eval and HEP  Person educated: Patient Education method: Explanation, Demonstration, Tactile cues, Verbal cues, and Handouts Education comprehension: verbalized understanding, returned demonstration, verbal cues required, and needs further education     GOALS: Goals reviewed with patient? Yes  LONG TERM GOALS: Target date: 8 wks   Patient be independent home program of wearing splint as well as home exercises for modalities and modifications to decrease pain in the left hand less than 2/10. Baseline: Patient with pain 6/10 with tenderness over the A1 pulleys of the 2nd and 4th.  Increased edema at the fourth locking every attempt of  flexion of the second.  Morning locking on the 3rd and 4th. Goal status: INITIAL  2.  Patient to verbalize decrease triggering of locking  of bilateral hands to less than 3 times a day. Baseline: Left hand triggering in the morning on the 3rd and 4th.  Second every attempt of flexion.  Right index finger locking in the morning. Goal status: INITIAL  3.  Patient to verbalize 3-4 modifications or joint protection that she implemented to decrease triggering or locking of bilateral hands as well as decreasing pain Baseline: Patient no knowledge of modifications and joint protection. Goal status: INITIAL  4.  Left hand digit flexion improved to within normal limits pain-free and symptom-free. Baseline: Left MC flexion 80 degrees and PIP is 90 degrees.  With tenderness and pain 6/10. Goal status: INITIAL  5.  Right grip strength improved with more than 5 pounds for patient to be able to use left hand with less triggering and brushing her teeth, using a knife, using her hands and bathing and dressing. Baseline:  Goal status: INITIAL ASSESSMENT:  CLINICAL IMPRESSION: Patient seen today for occupational therapy evaluation for bilateral hands with trigger fingers with the left worse than the right.  With a left index finger worse in the right index finger.  With increased edema in the 3rd and 4th digits with triggering at the the morning.  Patient pain 6/10 over the A1 pulleys.  Patient with decreased grip strength on the left more than the right.  With decreased prehension strength.  Patient is left-hand dominant.   NOW patient to continue to wear right second digit and left fourth digit MC block splint for second digits   Patient with decreased edema  and pain decrease at 4th L A1 pulley to 2-3/10  - show increase PROM at PIP/DIP and composite in session.  Reinforced with patient again splinting use as well as home program and modifications.  Reinforced with patient to use palms  or enlarge grips-to avoid any triggering.  Iontophoresis with dexamethasone for left fourth and R 2nd A1 pulley  - tolerated well.  Patient limited in functional use  of bilateral hands in ADLs IADLs because of triggering.  Increased pain, increased edema and decreased motion in the left more than the right with decreased strength.  Patient can benefit from skilled OT services to decrease pain, edema and triggering and increased motion and strength to return to prior level of function to avoid having the shots or surgery.  Patient is diabetic.  PERFORMANCE DEFICITS: in functional skills including ADLs, IADLs, ROM, strength, pain, flexibility, decreased knowledge of use of DME, and UE functional use,   and psychosocial skills including environmental adaptation and routines and behaviors.   IMPAIRMENTS: are limiting patient from ADLs, IADLs, rest and sleep, play, leisure, and social participation.   COMORBIDITIES: has no other co-morbidities that affects occupational performance. Patient will benefit from skilled OT to address above impairments and improve overall function.  MODIFICATION OR ASSISTANCE TO COMPLETE EVALUATION: No modification of tasks or assist necessary to complete an evaluation.  OT OCCUPATIONAL PROFILE AND HISTORY: Problem focused assessment: Including review of records relating to presenting problem.  CLINICAL DECISION MAKING: LOW - limited treatment options, no task modification necessary  REHAB POTENTIAL: Good for goals  EVALUATION COMPLEXITY: Low      PLAN:  OT FREQUENCY: 1-2x/week  OT DURATION: 8 wks   PLANNED INTERVENTIONS: 97168 OT Re-evaluation, 97535 self care/ADL training, 02889 therapeutic exercise, 97530 therapeutic activity, 97112 neuromuscular re-education, 97140  manual therapy, [id number] ultrasound, 02981 paraffin, [id number] fluidotherapy, [id number] contrast bath, 97033 iontophoresis, 97760 Orthotic Initial, [id number] Orthotic/Prosthetic subsequent, patient/family education, and DME and/or AE instructions    CONSULTED AND AGREED WITH PLAN OF CARE: Patient     Ancel Peters, OTR/L,CLT 02/08/2024, 1:47 PM

## 2024-02-14 ENCOUNTER — Ambulatory Visit: Admitting: Occupational Therapy

## 2024-02-14 DIAGNOSIS — M65342 Trigger finger, left ring finger: Secondary | ICD-10-CM

## 2024-02-14 DIAGNOSIS — M65322 Trigger finger, left index finger: Secondary | ICD-10-CM | POA: Diagnosis not present

## 2024-02-14 DIAGNOSIS — M6281 Muscle weakness (generalized): Secondary | ICD-10-CM

## 2024-02-14 DIAGNOSIS — M25641 Stiffness of right hand, not elsewhere classified: Secondary | ICD-10-CM

## 2024-02-14 DIAGNOSIS — M65321 Trigger finger, right index finger: Secondary | ICD-10-CM

## 2024-02-17 ENCOUNTER — Ambulatory Visit: Admitting: Occupational Therapy

## 2024-02-17 DIAGNOSIS — M6281 Muscle weakness (generalized): Secondary | ICD-10-CM

## 2024-02-17 DIAGNOSIS — M65342 Trigger finger, left ring finger: Secondary | ICD-10-CM

## 2024-02-17 DIAGNOSIS — M25641 Stiffness of right hand, not elsewhere classified: Secondary | ICD-10-CM

## 2024-02-17 DIAGNOSIS — M65322 Trigger finger, left index finger: Secondary | ICD-10-CM

## 2024-02-17 DIAGNOSIS — M65321 Trigger finger, right index finger: Secondary | ICD-10-CM

## 2024-02-19 ENCOUNTER — Encounter: Payer: Self-pay | Admitting: Occupational Therapy

## 2024-02-19 NOTE — Therapy (Signed)
 OUTPATIENT OCCUPATIONAL THERAPY ORTHO TREATMENT  Patient Name: Kara Wood MRN: [id number] DOB:10-17-1945, 78 y.o., female Today's Date: 02/19/2024  PCP: Dr Auston REFERRING PROVIDER: Dr Kathlynn  END OF SESSION:  OT End of Session - 02/19/24 1300     Visit Number 7    Number of Visits 10    Date for OT Re-Evaluation 03/07/24    OT Start Time 1430    OT Stop Time 1520    OT Time Calculation (min) 50 min    Activity Tolerance Patient tolerated treatment well    Behavior During Therapy WFL for tasks assessed/performed           Past Medical History:  Diagnosis Date   Arthritis    Diabetes (HCC)    High cholesterol    Hypertension    Neuropathy in diabetes (HCC)    Stage 3 chronic kidney disease (HCC)    Past Surgical History:  Procedure Laterality Date   BACK SURGERY     CATARACT EXTRACTION W/PHACO Right 12/17/2014   Procedure: CATARACT EXTRACTION PHACO AND INTRAOCULAR LENS PLACEMENT (IOC);  Surgeon: Steven Dingeldein, MD;  Location: [facility] ORS;  Service: Ophthalmology;  Laterality: Right;  US  01:17 AP% 26.0 CDE 33.35   CHOLECYSTECTOMY     COLONOSCOPY WITH PROPOFOL  N/A 10/14/2023   Procedure: COLONOSCOPY WITH PROPOFOL ;  Surgeon: Onita Elspeth Sharper, DO;  Location: [facility] ENDOSCOPY;  Service: Gastroenterology;  Laterality: N/A;  DM   ESOPHAGOGASTRODUODENOSCOPY (EGD) WITH PROPOFOL  N/A 10/14/2023   Procedure: ESOPHAGOGASTRODUODENOSCOPY (EGD) WITH PROPOFOL ;  Surgeon: Onita Elspeth Sharper, DO;  Location: [facility] ENDOSCOPY;  Service: Gastroenterology;  Laterality: N/A;   POLYPECTOMY  10/14/2023   Procedure: POLYPECTOMY;  Surgeon: Onita Elspeth Sharper, DO;  Location: [facility] ENDOSCOPY;  Service: Gastroenterology;;   There are no active problems to display for this patient.   ONSET DATE: 6 months  REFERRING DIAG: Trigger fingers in bilateral hands   THERAPY DIAG:  Trigger index finger of left hand  Trigger finger, left ring finger  Trigger finger, right index finger  Muscle  weakness (generalized)  Stiffness of joints of both hands  Rationale for Evaluation and Treatment: Rehabilitation  SUBJECTIVE:   SUBJECTIVE STATEMENT: See below in treatment note for subjective Pt accompanied by: self  PERTINENT HISTORY: 12/20/23 Dr Kathlynn Note - Assessment/Plan:   Assessment & Plan Trigger finger  She has bilateral index finger trigger finger, with the left worse than the right, and the left ring finger is also affected but cannot flex to lock. Hyperglycemia likely exacerbates the condition. There are concerns about complications from corticosteroid injections due to elevated A1c levels (8.2). Informed consent covered risks of injections, especially with diabetes, and the potential need for surgery if conservative measures fail. Refer to hand therapy for bilateral index and left ring finger trigger finger. Use Voltaren gel topically on affected fingers three to four times a day. Avoid oral NSAIDs due to potential kidney impact. Re-evaluate after a few weeks of hand therapy; consider corticosteroid injection if no improvement. Discuss potential need for surgery if conservative measures fail and blood sugar levels improve.  Type 2 diabetes mellitus with hyperglycemia  She has type 2 diabetes mellitus with a recent A1c of 8.2, indicating poor glycemic control. Current medications include metformin, pioglitazone (Actos), and Lantus insulin. Hyperglycemia is a concern for potential complications with trigger finger treatment. Encourage strict glycemic control to potentially improve trigger finger symptoms and reduce risks associated with corticosteroid injections. Diagnoses and all orders for this visit:  Trigger index finger of  left hand - Ambulatory Referral to Occupational Therapy  Trigger index finger of right hand - Ambulatory Referral to Occupational Therapy  Trigger ring finger of left hand - Ambulatory Referral to Occupational Therapy   PRECAUTIONS: None    WEIGHT  BEARING RESTRICTIONS: No  PAIN:  Are you having pain? 2-3/10 pain A1 pulleys L 4th   FALLS: Has patient fallen in last 6 months? No  LIVING ENVIRONMENT: Lives with: lives with their spouse   PLOF: Independent in basic ADLs and lighthouse activities. Patient mostly watching TV and on her phone.  And read a lot.  Do some laundry and cooking  PATIENT GOALS: Wants to get my trigger fingers better and avoid surgery.  NEXT MD VISIT:  4 wk after OT   OBJECTIVE:  Note: Objective measures were completed at Evaluation unless otherwise noted.  HAND DOMINANCE: Left  ADLs: Patient report increase of dropping objects.  As well as when gripping with fitting sheet or laundry or cutting food increased pain as well as locking of fingers in bilateral hands with the left worse than the right.   FUNCTIONAL OUTCOME MEASURES: Reassess next session  UPPER EXTREMITY ROM:   Wrist active range of motion in forearm within normal limits pain-free  Active ROM Right eval Left eval R 02/08/24  Thumb MCP (0-60)     Thumb IP (0-80)     Thumb Radial abd/add (0-55)      Thumb Palmar abd/add (0-45)      Thumb Opposition to Small Finger      Index MCP (0-90) 80 locking in am  80  locking every time   Index PIP (0-100) 100  90   Index DIP (0-70)       Long MCP (0-90)  85  80 locked this am    Long PIP (0-100)  100  90   Long DIP (0-70)       Ring MCP (0-90)  85  80 increase edema  90 PROM  Ring PIP (0-100)  100  90 90 PROM   Ring DIP (0-70)     70 PROM  Little MCP (0-90)  90  80   Little PIP (0-100)  100  90   Little DIP (0-70)       (Blank rows = not tested) Patient with bilateral thumb CMC pain.  HAND FUNCTION: Grip strength: Right: 40 lbs; Left: 21 lbs, Lateral pinch: Right: 6 lbs, Left: 5 lbs, and 3 point pinch: Right: 5 lbs, Left: 6 lbs pain in the palm of bilateral hands left worse than the right  COORDINATION: Within functional limits have a hard time because of her thumbs with buttons and  zippers and small objects  SENSATION: Patient wears a wrist brace at nighttime on the left hand report numbness at night if not wearing the brace  EDEMA: Left fourth digit  COGNITION: Overall cognitive status: Within functional limits for tasks assessed  TREATMENT DATE: 02/14/24   Subjective: No pain at rest but increased pain with use or movement.  She has less pain at night now, doesn't wake her up anymore.  She was able to put on her earrings without pain.  Was able to increase grip on steering wheel.  Changed door knobs to lever style. She put put foam onto handles for broom.  She got someone to come and clean now.     Contrast: Time: 8 with  2 rotations of cold of 1 minute between, to decrease pain, inflammation and edema Location: bilateral  hands  Decrease stiffness and pain prior to review of TherEX and manual therapy        Manual Therapy:  Pt seen for manual tissue massage to mobilize tissue, decrease adhesions. Prior to range of motion done Graston tool #2 for gentle brushing over volar palm and digits prior to range of motion to increase blood flow decrease pain and increased motion.  Therapeutic Exercises:  MCP flexion with PIP extension 10 reps Passive range of motion pain-free DIP/PIP flexion pain-free  Patient can follow-up by gentle passive range of motion composite flexion pain-free  Iontophoresis with dexamethasone done small patch over left fourth A1 pulley and right second 2.0 current - patient tolerated well  -patient to keep patch on for about an hour afterwards.  Pt continues to wear splints as needed to prevent triggering and locking with motions.  Denies any difficulty with splints.                                                                                                                Pt to also perform ice massage over the A1 pulleys of bilateral hands that is tender during the day several times to decrease tenderness and pain and inflammation.  PATIENT  EDUCATION: Education details: findings of eval and HEP  Person educated: Patient Education method: Explanation, Demonstration, Tactile cues, Verbal cues, and Handouts Education comprehension: verbalized understanding, returned demonstration, verbal cues required, and needs further education     GOALS: Goals reviewed with patient? Yes  LONG TERM GOALS: Target date: 8 wks   Patient be independent home program of wearing splint as well as home exercises for modalities and modifications to decrease pain in the left hand less than 2/10. Baseline: Patient with pain 6/10 with tenderness over the A1 pulleys of the 2nd and 4th.  Increased edema at the fourth locking every attempt of flexion of the second.  Morning locking on the 3rd and 4th. Goal status: INITIAL  2.  Patient to verbalize decrease triggering of locking of bilateral hands to less than 3 times a day. Baseline: Left hand triggering in the morning on the 3rd and 4th.  Second every attempt of flexion.  Right index finger locking in the morning. Goal status: INITIAL  3.  Patient to verbalize 3-4 modifications or joint protection that she implemented to decrease triggering or locking of bilateral hands as well as decreasing pain Baseline: Patient no knowledge of modifications and joint protection. Goal status: INITIAL  4.  Left hand digit flexion improved to within normal limits pain-free and symptom-free. Baseline: Left MC flexion 80 degrees and PIP is 90 degrees.  With tenderness and pain 6/10. Goal status: INITIAL  5.  Right grip strength improved with more than 5 pounds for patient to be able to use left hand with less triggering and brushing her teeth, using a knife, using her hands and bathing and dressing. Baseline:  Goal status: INITIAL ASSESSMENT:  CLINICAL IMPRESSION: Patient seen for occupational therapy evaluation for bilateral hands with trigger fingers with the  left worse than the right.  With a left index finger  worse than the right index finger.  Increased edema in the 3rd and 4th digits with triggering at the the morning.  Patient pain 6/10 over the A1 pulleys at eval.  Patient with decreased grip strength on the left more than the right.  With decreased prehension strength.  Patient is left-hand dominant.  Patient to continue to wear right second digit and left fourth digit MC block splint for second digits especially during functional activities.  Patient with decreased edema  and pain decrease overall at 4th L A1 pulley to 2-3/10, demonstrates increased PROM at PIP/DIP and composite during the last couple sessions. Iontophoresis with dexamethasone for left fourth and R 2nd A1 pulley continues to tolerate well. Pt with no pain this date at rest on arrival, she reports functional improvements such as being able to put on her earrings now.  She has incorporated recommendations on modifying tools and equipment at home for homemaking tasks. Patient remains limited in functional use of bilateral hands in ADLs IADLs because of triggering but improving overall with therapy. Patient can benefit from skilled OT services to decrease pain, edema and triggering and increased motion and strength to return to prior level of function to avoid having the shots or surgery.  Patient is diabetic.  PERFORMANCE DEFICITS: in functional skills including ADLs, IADLs, ROM, strength, pain, flexibility, decreased knowledge of use of DME, and UE functional use,   and psychosocial skills including environmental adaptation and routines and behaviors.   IMPAIRMENTS: are limiting patient from ADLs, IADLs, rest and sleep, play, leisure, and social participation.   COMORBIDITIES: has no other co-morbidities that affects occupational performance. Patient will benefit from skilled OT to address above impairments and improve overall function.  MODIFICATION OR ASSISTANCE TO COMPLETE EVALUATION: No modification of tasks or assist necessary to  complete an evaluation.  OT OCCUPATIONAL PROFILE AND HISTORY: Problem focused assessment: Including review of records relating to presenting problem.  CLINICAL DECISION MAKING: LOW - limited treatment options, no task modification necessary  REHAB POTENTIAL: Good for goals  EVALUATION COMPLEXITY: Low   PLAN:  OT FREQUENCY: 1-2x/week  OT DURATION: 8 wks   PLANNED INTERVENTIONS: 97168 OT Re-evaluation, 97535 self care/ADL training, 02889 therapeutic exercise, 97530 therapeutic activity, 97112 neuromuscular re-education, 97140 manual therapy, 97035 ultrasound, 97018 paraffin, 02960 fluidotherapy, 97034 contrast bath, 97033 iontophoresis, 97760 Orthotic Initial, [id number] Orthotic/Prosthetic subsequent, patient/family education, and DME and/or AE instructions   CONSULTED AND AGREED WITH PLAN OF CARE: Patient   Myrah Strawderman, OTR/L,CLT 02/19/2024, 1:01 PM

## 2024-02-19 NOTE — Therapy (Signed)
 OUTPATIENT OCCUPATIONAL THERAPY ORTHO TREATMENT  Patient Name: Kara Wood MRN: [id number] DOB:August 05, 1946, 78 y.o., female Today's Date: 02/19/2024  PCP: Dr Auston REFERRING PROVIDER: Dr Kathlynn  END OF SESSION:  OT End of Session - 02/19/24 2050     Visit Number 8    Number of Visits 10    Date for OT Re-Evaluation 03/07/24    OT Start Time 1030    OT Stop Time 1115    OT Time Calculation (min) 45 min    Activity Tolerance Patient tolerated treatment well    Behavior During Therapy WFL for tasks assessed/performed           Past Medical History:  Diagnosis Date   Arthritis    Diabetes (HCC)    High cholesterol    Hypertension    Neuropathy in diabetes (HCC)    Stage 3 chronic kidney disease (HCC)    Past Surgical History:  Procedure Laterality Date   BACK SURGERY     CATARACT EXTRACTION W/PHACO Right 12/17/2014   Procedure: CATARACT EXTRACTION PHACO AND INTRAOCULAR LENS PLACEMENT (IOC);  Surgeon: Steven Dingeldein, MD;  Location: [facility] ORS;  Service: Ophthalmology;  Laterality: Right;  US  01:17 AP% 26.0 CDE 33.35   CHOLECYSTECTOMY     COLONOSCOPY WITH PROPOFOL  N/A 10/14/2023   Procedure: COLONOSCOPY WITH PROPOFOL ;  Surgeon: Onita Elspeth Sharper, DO;  Location: [facility] ENDOSCOPY;  Service: Gastroenterology;  Laterality: N/A;  DM   ESOPHAGOGASTRODUODENOSCOPY (EGD) WITH PROPOFOL  N/A 10/14/2023   Procedure: ESOPHAGOGASTRODUODENOSCOPY (EGD) WITH PROPOFOL ;  Surgeon: Onita Elspeth Sharper, DO;  Location: [facility] ENDOSCOPY;  Service: Gastroenterology;  Laterality: N/A;   POLYPECTOMY  10/14/2023   Procedure: POLYPECTOMY;  Surgeon: Onita Elspeth Sharper, DO;  Location: [facility] ENDOSCOPY;  Service: Gastroenterology;;   There are no active problems to display for this patient.   ONSET DATE: 6 months  REFERRING DIAG: Trigger fingers in bilateral hands   THERAPY DIAG:  Trigger index finger of left hand  Trigger finger, left ring finger  Trigger finger, right index finger  Muscle  weakness (generalized)  Stiffness of joints of both hands  Rationale for Evaluation and Treatment: Rehabilitation  SUBJECTIVE:   SUBJECTIVE STATEMENT: See below in treatment note for subjective Pt accompanied by: self  PERTINENT HISTORY: 12/20/23 Dr Kathlynn Note - Assessment/Plan:   Assessment & Plan Trigger finger  She has bilateral index finger trigger finger, with the left worse than the right, and the left ring finger is also affected but cannot flex to lock. Hyperglycemia likely exacerbates the condition. There are concerns about complications from corticosteroid injections due to elevated A1c levels (8.2). Informed consent covered risks of injections, especially with diabetes, and the potential need for surgery if conservative measures fail. Refer to hand therapy for bilateral index and left ring finger trigger finger. Use Voltaren gel topically on affected fingers three to four times a day. Avoid oral NSAIDs due to potential kidney impact. Re-evaluate after a few weeks of hand therapy; consider corticosteroid injection if no improvement. Discuss potential need for surgery if conservative measures fail and blood sugar levels improve.  Type 2 diabetes mellitus with hyperglycemia  She has type 2 diabetes mellitus with a recent A1c of 8.2, indicating poor glycemic control. Current medications include metformin, pioglitazone (Actos), and Lantus insulin. Hyperglycemia is a concern for potential complications with trigger finger treatment. Encourage strict glycemic control to potentially improve trigger finger symptoms and reduce risks associated with corticosteroid injections. Diagnoses and all orders for this visit:  Trigger index finger of  left hand - Ambulatory Referral to Occupational Therapy  Trigger index finger of right hand - Ambulatory Referral to Occupational Therapy  Trigger ring finger of left hand - Ambulatory Referral to Occupational Therapy   PRECAUTIONS: None    WEIGHT  BEARING RESTRICTIONS: No  PAIN:  Are you having pain? 2-3/10 pain A1 pulleys L 4th   FALLS: Has patient fallen in last 6 months? No  LIVING ENVIRONMENT: Lives with: lives with their spouse   PLOF: Independent in basic ADLs and lighthouse activities. Patient mostly watching TV and on her phone.  And read a lot.  Do some laundry and cooking  PATIENT GOALS: Wants to get my trigger fingers better and avoid surgery.  NEXT MD VISIT:  4 wk after OT   OBJECTIVE:  Note: Objective measures were completed at Evaluation unless otherwise noted.  HAND DOMINANCE: Left  ADLs: Patient report increase of dropping objects.  As well as when gripping with fitting sheet or laundry or cutting food increased pain as well as locking of fingers in bilateral hands with the left worse than the right.   FUNCTIONAL OUTCOME MEASURES: Reassess next session  UPPER EXTREMITY ROM:   Wrist active range of motion in forearm within normal limits pain-free  Active ROM Right eval Left eval R 02/08/24  Thumb MCP (0-60)     Thumb IP (0-80)     Thumb Radial abd/add (0-55)      Thumb Palmar abd/add (0-45)      Thumb Opposition to Small Finger      Index MCP (0-90) 80 locking in am  80  locking every time   Index PIP (0-100) 100  90   Index DIP (0-70)       Long MCP (0-90)  85  80 locked this am    Long PIP (0-100)  100  90   Long DIP (0-70)       Ring MCP (0-90)  85  80 increase edema  90 PROM  Ring PIP (0-100)  100  90 90 PROM   Ring DIP (0-70)     70 PROM  Little MCP (0-90)  90  80   Little PIP (0-100)  100  90   Little DIP (0-70)       (Blank rows = not tested) Patient with bilateral thumb CMC pain.  HAND FUNCTION: Grip strength: Right: 40 lbs; Left: 21 lbs, Lateral pinch: Right: 6 lbs, Left: 5 lbs, and 3 point pinch: Right: 5 lbs, Left: 6 lbs pain in the palm of bilateral hands left worse than the right  COORDINATION: Within functional limits have a hard time because of her thumbs with buttons and  zippers and small objects  SENSATION: Patient wears a wrist brace at nighttime on the left hand report numbness at night if not wearing the brace  EDEMA: Left fourth digit  COGNITION: Overall cognitive status: Within functional limits for tasks assessed  TREATMENT DATE: 02/17/24   Subjective: No pain on arrival this date, feels she is progressing well.    Contrast: Time: 8 with  2 rotations of cold of 1 minute between, to decrease pain, inflammation and edema Location: bilateral hands  Decrease stiffness and pain prior to review of TherEX and manual therapy        Manual Therapy:  Pt seen for manual tissue massage to mobilize tissue, decrease adhesions. Prior to range of motion done Graston tool #2 for gentle brushing over volar palm and digits prior to range of motion to  increase blood flow decrease pain and increased motion.  Therapeutic Exercises:  MCP flexion with PIP extension 10 reps Passive range of motion pain-free DIP/PIP flexion pain-free  Patient can follow-up by gentle passive range of motion composite flexion pain-free  Iontophoresis with dexamethasone done small patch over left fourth A1 pulley and right second 2.0 current - patient tolerated well  -patient to keep patch on for about an hour afterwards.  Pt to continue with current HEP  PATIENT EDUCATION: Education details: findings of eval and HEP  Person educated: Patient Education method: Explanation, Demonstration, Tactile cues, Verbal cues, and Handouts Education comprehension: verbalized understanding, returned demonstration, verbal cues required, and needs further education     GOALS: Goals reviewed with patient? Yes  LONG TERM GOALS: Target date: 8 wks   Patient be independent home program of wearing splint as well as home exercises for modalities and modifications to decrease pain in the left hand less than 2/10. Baseline: Patient with pain 6/10 with tenderness over the A1 pulleys of the 2nd and 4th.   Increased edema at the fourth locking every attempt of flexion of the second.  Morning locking on the 3rd and 4th. Goal status: INITIAL  2.  Patient to verbalize decrease triggering of locking of bilateral hands to less than 3 times a day. Baseline: Left hand triggering in the morning on the 3rd and 4th.  Second every attempt of flexion.  Right index finger locking in the morning. Goal status: INITIAL  3.  Patient to verbalize 3-4 modifications or joint protection that she implemented to decrease triggering or locking of bilateral hands as well as decreasing pain Baseline: Patient no knowledge of modifications and joint protection. Goal status: INITIAL  4.  Left hand digit flexion improved to within normal limits pain-free and symptom-free. Baseline: Left MC flexion 80 degrees and PIP is 90 degrees.  With tenderness and pain 6/10. Goal status: INITIAL  5.  Right grip strength improved with more than 5 pounds for patient to be able to use left hand with less triggering and brushing her teeth, using a knife, using her hands and bathing and dressing. Baseline:  Goal status: INITIAL ASSESSMENT:  CLINICAL IMPRESSION: Patient seen for occupational therapy evaluation for bilateral hands with trigger fingers with the left worse than the right.  With a left index finger worse than the right index finger.  Increased edema in the 3rd and 4th digits with triggering at the the morning.  Patient pain 6/10 over the A1 pulleys at eval.  Patient with decreased grip strength on the left more than the right.  With decreased prehension strength.  Patient is left-hand dominant.  Patient to continue to wear right second digit and left fourth digit MC block splint for second digits especially during functional activities.  Patient with decreased edema  and pain decrease overall at 4th L A1 pulley to 2-3/10, demonstrates increased PROM at PIP/DIP and composite during the last couple sessions. Iontophoresis with  dexamethasone for left fourth and R 2nd A1 pulley continues to tolerate well. Pain has improved significantly, none reported at rest. She is demonstrating improved functional use of bilateral hands especially on the right.  She has continued to incorporate recommendations on modifying tools and equipment at home for homemaking tasks. Patient remains limited in functional use of bilateral hands in ADLs IADLs because of triggering but improving overall with therapy. Patient can benefit from skilled OT services to decrease pain, edema and triggering and increased motion and strength to return  to prior level of function to avoid having the shots or surgery.  Patient is diabetic.  PERFORMANCE DEFICITS: in functional skills including ADLs, IADLs, ROM, strength, pain, flexibility, decreased knowledge of use of DME, and UE functional use,   and psychosocial skills including environmental adaptation and routines and behaviors.   IMPAIRMENTS: are limiting patient from ADLs, IADLs, rest and sleep, play, leisure, and social participation.   COMORBIDITIES: has no other co-morbidities that affects occupational performance. Patient will benefit from skilled OT to address above impairments and improve overall function.  MODIFICATION OR ASSISTANCE TO COMPLETE EVALUATION: No modification of tasks or assist necessary to complete an evaluation.  OT OCCUPATIONAL PROFILE AND HISTORY: Problem focused assessment: Including review of records relating to presenting problem.  CLINICAL DECISION MAKING: LOW - limited treatment options, no task modification necessary  REHAB POTENTIAL: Good for goals  EVALUATION COMPLEXITY: Low   PLAN:  OT FREQUENCY: 1-2x/week  OT DURATION: 8 wks   PLANNED INTERVENTIONS: 97168 OT Re-evaluation, 97535 self care/ADL training, 02889 therapeutic exercise, 97530 therapeutic activity, 97112 neuromuscular re-education, 97140 manual therapy, 97035 ultrasound, 97018 paraffin, 02960 fluidotherapy,  97034 contrast bath, 97033 iontophoresis, 97760 Orthotic Initial, [id number] Orthotic/Prosthetic subsequent, patient/family education, and DME and/or AE instructions   CONSULTED AND AGREED WITH PLAN OF CARE: Patient   Shahida Schnackenberg, OTR/L,CLT 02/19/2024, 8:51 PM

## 2024-02-22 ENCOUNTER — Ambulatory Visit: Admitting: Occupational Therapy

## 2024-02-22 DIAGNOSIS — M6281 Muscle weakness (generalized): Secondary | ICD-10-CM

## 2024-02-22 DIAGNOSIS — M65322 Trigger finger, left index finger: Secondary | ICD-10-CM | POA: Diagnosis not present

## 2024-02-22 DIAGNOSIS — M25641 Stiffness of right hand, not elsewhere classified: Secondary | ICD-10-CM

## 2024-02-22 DIAGNOSIS — M65321 Trigger finger, right index finger: Secondary | ICD-10-CM

## 2024-02-22 DIAGNOSIS — M65342 Trigger finger, left ring finger: Secondary | ICD-10-CM

## 2024-02-24 ENCOUNTER — Encounter: Payer: Self-pay | Admitting: Occupational Therapy

## 2024-02-24 ENCOUNTER — Ambulatory Visit: Admitting: Occupational Therapy

## 2024-02-24 DIAGNOSIS — M65342 Trigger finger, left ring finger: Secondary | ICD-10-CM

## 2024-02-24 DIAGNOSIS — M25641 Stiffness of right hand, not elsewhere classified: Secondary | ICD-10-CM

## 2024-02-24 DIAGNOSIS — M65322 Trigger finger, left index finger: Secondary | ICD-10-CM | POA: Diagnosis not present

## 2024-02-24 DIAGNOSIS — M6281 Muscle weakness (generalized): Secondary | ICD-10-CM

## 2024-02-24 DIAGNOSIS — M65321 Trigger finger, right index finger: Secondary | ICD-10-CM

## 2024-02-24 NOTE — Therapy (Signed)
 OUTPATIENT OCCUPATIONAL THERAPY ORTHO TREATMENT/10th visit  Patient Name: Kara Wood MRN: [id number] DOB:[birth year], 78 y.o., female Today's Date: 02/24/2024  PCP: Dr Auston REFERRING PROVIDER: Dr Kathlynn  END OF SESSION:  OT End of Session - 02/24/24 1118     Visit Number 10    Number of Visits 12    Date for OT Re-Evaluation 03/07/24    OT Start Time 1118    OT Stop Time 1204    OT Time Calculation (min) 46 min    Activity Tolerance Patient tolerated treatment well    Behavior During Therapy WFL for tasks assessed/performed           Past Medical History:  Diagnosis Date   Arthritis    Diabetes (HCC)    High cholesterol    Hypertension    Neuropathy in diabetes (HCC)    Stage 3 chronic kidney disease (HCC)    Past Surgical History:  Procedure Laterality Date   BACK SURGERY     CATARACT EXTRACTION W/PHACO Right 12/17/2014   Procedure: CATARACT EXTRACTION PHACO AND INTRAOCULAR LENS PLACEMENT (IOC);  Surgeon: Steven Dingeldein, MD;  Location: [facility] ORS;  Service: Ophthalmology;  Laterality: Right;  US  01:17 AP% 26.0 CDE 33.35   CHOLECYSTECTOMY     COLONOSCOPY WITH PROPOFOL  N/A 10/14/2023   Procedure: COLONOSCOPY WITH PROPOFOL ;  Surgeon: Onita Elspeth Sharper, DO;  Location: [facility] ENDOSCOPY;  Service: Gastroenterology;  Laterality: N/A;  DM   ESOPHAGOGASTRODUODENOSCOPY (EGD) WITH PROPOFOL  N/A 10/14/2023   Procedure: ESOPHAGOGASTRODUODENOSCOPY (EGD) WITH PROPOFOL ;  Surgeon: Onita Elspeth Sharper, DO;  Location: [facility] ENDOSCOPY;  Service: Gastroenterology;  Laterality: N/A;   POLYPECTOMY  10/14/2023   Procedure: POLYPECTOMY;  Surgeon: Onita Elspeth Sharper, DO;  Location: [facility] ENDOSCOPY;  Service: Gastroenterology;;   There are no active problems to display for this patient.   ONSET DATE: 6 months  REFERRING DIAG: Trigger fingers in bilateral hands   THERAPY DIAG:  Trigger index finger of left hand  Trigger finger, left ring finger  Trigger finger, right index  finger  Muscle weakness (generalized)  Stiffness of joints of both hands  Rationale for Evaluation and Treatment: Rehabilitation  SUBJECTIVE:   SUBJECTIVE STATEMENT: R hand doing well-index finger blocking really.  Up in cleaning house little bit to left hand feels little worse.  But definitely better.  Less locking on my ring finger.  The index finger is acting up today. Pt accompanied by: self  PERTINENT HISTORY: 12/20/23 Dr Kathlynn Note - Assessment/Plan:   Assessment & Plan Trigger finger  She has bilateral index finger trigger finger, with the left worse than the right, and the left ring finger is also affected but cannot flex to lock. Hyperglycemia likely exacerbates the condition. There are concerns about complications from corticosteroid injections due to elevated A1c levels (8.2). Informed consent covered risks of injections, especially with diabetes, and the potential need for surgery if conservative measures fail. Refer to hand therapy for bilateral index and left ring finger trigger finger. Use Voltaren gel topically on affected fingers three to four times a day. Avoid oral NSAIDs due to potential kidney impact. Re-evaluate after a few weeks of hand therapy; consider corticosteroid injection if no improvement. Discuss potential need for surgery if conservative measures fail and blood sugar levels improve.  Type 2 diabetes mellitus with hyperglycemia  She has type 2 diabetes mellitus with a recent A1c of 8.2, indicating poor glycemic control. Current medications include metformin, pioglitazone (Actos), and Lantus insulin. Hyperglycemia is a concern for potential complications  with trigger finger treatment. Encourage strict glycemic control to potentially improve trigger finger symptoms and reduce risks associated with corticosteroid injections. Diagnoses and all orders for this visit:  Trigger index finger of left hand - Ambulatory Referral to Occupational Therapy  Trigger index  finger of right hand - Ambulatory Referral to Occupational Therapy  Trigger ring finger of left hand - Ambulatory Referral to Occupational Therapy   PRECAUTIONS: None    WEIGHT BEARING RESTRICTIONS: No  PAIN:  Are you having pain? 7/10 pain A1 pulleys L 4th - per pt she clean some around the house   FALLS: Has patient fallen in last 6 months? No  LIVING ENVIRONMENT: Lives with: lives with their spouse   PLOF: Independent in basic ADLs and lighthouse activities. Patient mostly watching TV and on her phone.  And read a lot.  Do some laundry and cooking  PATIENT GOALS: Wants to get my trigger fingers better and avoid surgery.  NEXT MD VISIT:  4 wk after OT   OBJECTIVE:  Note: Objective measures were completed at Evaluation unless otherwise noted.  HAND DOMINANCE: Left  ADLs: Patient report increase of dropping objects.  As well as when gripping with fitting sheet or laundry or cutting food increased pain as well as locking of fingers in bilateral hands with the left worse than the right.   FUNCTIONAL OUTCOME MEASURES: Reassess next session  UPPER EXTREMITY ROM:   Wrist active range of motion in forearm within normal limits pain-free  Active ROM Right eval Left eval R 02/08/24 R/L 02/24/24   Thumb MCP (0-60)      Thumb IP (0-80)      Thumb Radial abd/add (0-55)       Thumb Palmar abd/add (0-45)       Thumb Opposition to Small Finger       Index MCP (0-90) 80 locking in am  80  locking every time  R No locking last few days / L locking today  Index PIP (0-100) 100  90    Index DIP (0-70)        Long MCP (0-90)  85  80 locked this am     Long PIP (0-100)  100  90    Long DIP (0-70)        Ring MCP (0-90)  85  80 increase edema  90 PROM 90  Ring PIP (0-100)  100  90 90 PROM  90  Ring DIP (0-70)     70 PROM 70  Little MCP (0-90)  90  80    Little PIP (0-100)  100  90    Little DIP (0-70)        (Blank rows = not tested) Patient with bilateral thumb CMC  pain.  HAND FUNCTION: Grip strength: Right: 40 lbs; Left: 21 lbs, Lateral pinch: Right: 6 lbs, Left: 5 lbs, and 3 point pinch: Right: 5 lbs, Left: 6 lbs pain in the palm of bilateral hands left worse than the right 02/24/24: Grip strength: Right: 40 lbs; Left: 35 lbs, Lateral pinch: Right: 6 lbs, Left: 5 lbs, and 3 point pinch: Right: 7 lbs, Left: 6 lbs  COORDINATION: Within functional limits have a hard time because of her thumbs with buttons and zippers and small objects  SENSATION: Patient wears a wrist brace at nighttime on the left hand report numbness at night if not wearing the brace  EDEMA: Left fourth digit  COGNITION: Overall cognitive status: Within functional limits for tasks assessed  TREATMENT  DATE: 02/24/24     Contrast: Time: 8 with  2 rotations of cold of 1 minute between, to decrease pain, inflammation and edema Location: bilateral hands  Decrease stiffness and pain prior to review of TherEX and manual therapy        Manual Therapy:  Pt seen for manual tissue massage to mobilize tissue, decrease adhesions and to encourage lymphatic drainage by opening pathways. Use of Graston tool #6 for gentle brushing over volar palm and digits prior to range of motion to increase blood flow decrease pain and increased motion.  Patient can do ice massage over left 2nd and 4th A1 pulley several times during the day Continue contrast 2-3 times a day Fitted with Isotoner glove for the left hand for nighttime.  Therapeutic Exercises:   Following manual therapy, pt seen for exercises with focus on MCP flexion with PIP extension 10 reps Passive range of motion pain-free DIP/PIP flexion pain-free  Gentle passive range of motion composite flexion pain-free REINFORCE   Iontophoresis: Iontophoresis with dexamethasone with small patch over left fourth A1 pulley 2.0 current - patient tolerated well  -patient to keep patch on for about an hour afterwards. Skin inspected prior to treatment  with no issues.  Performed ionto only to left hand this date, right with no tenderness or issues.  Pt to continue with current HEP  PATIENT EDUCATION: Education details: findings of eval and HEP  Person educated: Patient Education method: Explanation, Demonstration, Tactile cues, Verbal cues, and Handouts Education comprehension: verbalized understanding, returned demonstration, verbal cues required, and needs further education     GOALS: Goals reviewed with patient? Yes  LONG TERM GOALS: Target date: 8 wks   Patient be independent home program of wearing splint as well as home exercises for modalities and modifications to decrease pain in the left hand less than 2/10. Baseline: Patient with pain 6/10 with tenderness over the A1 pulleys of the 2nd and 4th.  Increased edema at the fourth locking every attempt of flexion of the second.  Morning locking on the 3rd and 4th. Goal status: INITIAL  2.  Patient to verbalize decrease triggering of locking of bilateral hands to less than 3 times a day. Baseline: Left hand triggering in the morning on the 3rd and 4th.  Second every attempt of flexion.  Right index finger locking in the morning. Goal status: INITIAL  3.  Patient to verbalize 3-4 modifications or joint protection that she implemented to decrease triggering or locking of bilateral hands as well as decreasing pain Baseline: Patient no knowledge of modifications and joint protection. Goal status: INITIAL  4.  Left hand digit flexion improved to within normal limits pain-free and symptom-free. Baseline: Left MC flexion 80 degrees and PIP is 90 degrees.  With tenderness and pain 6/10. Goal status: INITIAL  5.  Right grip strength improved with more than 5 pounds for patient to be able to use left hand with less triggering and brushing her teeth, using a knife, using her hands and bathing and dressing. Baseline:  Goal status: INITIAL ASSESSMENT:  CLINICAL IMPRESSION: Patient seen  for occupational therapy evaluation for bilateral hands with trigger fingers with the left worse than the right.  With a left index finger worse than the right index finger.  Increased edema in the 3rd and 4th digits with triggering at the the morning.  Patient pain 6/10 over the A1 pulleys at eval.  Patient with decreased grip strength on the left more than the right.  With  decreased prehension strength.  Patient is left-hand dominant.  Patient to continue to wear right second digit and left fourth digit MC block splint for second digits especially during functional activities.  Patient with decreased edema  and pain decrease overall at 4th L A1 pulley, none at rest and 7/10 with palpation at A1 pulley.  Patient Clinsol.  Pt demonstrates increased PROM at PIP/DIP and composite over the 2-3 wks -Iontophoresis with dexamethasone for left fourth A1 pulley, no ionto to the right today. She is demonstrating improved functional use of bilateral hands especially on the right and has continued to engage in functional tasks at home.  She has continued to incorporate recommendations on modifying tools and equipment at home for homemaking tasks. Continued progress in all areas. Patient can benefit from skilled OT services to decrease pain, edema and triggering and increased motion and strength to return to prior level of function to avoid having the shots or surgery.  Patient is diabetic.  PERFORMANCE DEFICITS: in functional skills including ADLs, IADLs, ROM, strength, pain, flexibility, decreased knowledge of use of DME, and UE functional use,   and psychosocial skills including environmental adaptation and routines and behaviors.   IMPAIRMENTS: are limiting patient from ADLs, IADLs, rest and sleep, play, leisure, and social participation.   COMORBIDITIES: has no other co-morbidities that affects occupational performance. Patient will benefit from skilled OT to address above impairments and improve overall  function.  MODIFICATION OR ASSISTANCE TO COMPLETE EVALUATION: No modification of tasks or assist necessary to complete an evaluation.  OT OCCUPATIONAL PROFILE AND HISTORY: Problem focused assessment: Including review of records relating to presenting problem.  CLINICAL DECISION MAKING: LOW - limited treatment options, no task modification necessary  REHAB POTENTIAL: Good for goals  EVALUATION COMPLEXITY: Low   PLAN:  OT FREQUENCY: 1-2x/week  OT DURATION: 8 wks   PLANNED INTERVENTIONS: 97168 OT Re-evaluation, 97535 self care/ADL training, 02889 therapeutic exercise, 97530 therapeutic activity, 97112 neuromuscular re-education, 97140 manual therapy, 97035 ultrasound, 97018 paraffin, 02960 fluidotherapy, 97034 contrast bath, 97033 iontophoresis, 97760 Orthotic Initial, [id number] Orthotic/Prosthetic subsequent, patient/family education, and DME and/or AE instructions   CONSULTED AND AGREED WITH PLAN OF CARE: Patient   Ancel Peters, OTR/L,CLT 02/24/2024, 7:26 PM

## 2024-02-24 NOTE — Therapy (Signed)
 OUTPATIENT OCCUPATIONAL THERAPY ORTHO TREATMENT  Patient Name: Kara Wood MRN: [id number] DOB:25-May-1946, 78 y.o., female Today's Date: 02/24/2024  PCP: Dr Auston REFERRING PROVIDER: Dr Kathlynn  END OF SESSION:  OT End of Session - 02/24/24 0926     Visit Number 9    Number of Visits 10    Date for OT Re-Evaluation 03/07/24    OT Start Time 1401    OT Stop Time 1445    OT Time Calculation (min) 44 min    Activity Tolerance Patient tolerated treatment well    Behavior During Therapy WFL for tasks assessed/performed           Past Medical History:  Diagnosis Date   Arthritis    Diabetes (HCC)    High cholesterol    Hypertension    Neuropathy in diabetes (HCC)    Stage 3 chronic kidney disease (HCC)    Past Surgical History:  Procedure Laterality Date   BACK SURGERY     CATARACT EXTRACTION W/PHACO Right 12/17/2014   Procedure: CATARACT EXTRACTION PHACO AND INTRAOCULAR LENS PLACEMENT (IOC);  Surgeon: Steven Dingeldein, MD;  Location: [facility] ORS;  Service: Ophthalmology;  Laterality: Right;  US  01:17 AP% 26.0 CDE 33.35   CHOLECYSTECTOMY     COLONOSCOPY WITH PROPOFOL  N/A 10/14/2023   Procedure: COLONOSCOPY WITH PROPOFOL ;  Surgeon: Onita Elspeth Sharper, DO;  Location: [facility] ENDOSCOPY;  Service: Gastroenterology;  Laterality: N/A;  DM   ESOPHAGOGASTRODUODENOSCOPY (EGD) WITH PROPOFOL  N/A 10/14/2023   Procedure: ESOPHAGOGASTRODUODENOSCOPY (EGD) WITH PROPOFOL ;  Surgeon: Onita Elspeth Sharper, DO;  Location: [facility] ENDOSCOPY;  Service: Gastroenterology;  Laterality: N/A;   POLYPECTOMY  10/14/2023   Procedure: POLYPECTOMY;  Surgeon: Onita Elspeth Sharper, DO;  Location: [facility] ENDOSCOPY;  Service: Gastroenterology;;   There are no active problems to display for this patient.   ONSET DATE: 6 months  REFERRING DIAG: Trigger fingers in bilateral hands   THERAPY DIAG:  Trigger index finger of left hand  Trigger finger, left ring finger  Trigger finger, right index finger  Muscle  weakness (generalized)  Stiffness of joints of both hands  Rationale for Evaluation and Treatment: Rehabilitation  SUBJECTIVE:   SUBJECTIVE STATEMENT: See below in treatment note for subjective Pt accompanied by: self  PERTINENT HISTORY: 12/20/23 Dr Kathlynn Note - Assessment/Plan:   Assessment & Plan Trigger finger  She has bilateral index finger trigger finger, with the left worse than the right, and the left ring finger is also affected but cannot flex to lock. Hyperglycemia likely exacerbates the condition. There are concerns about complications from corticosteroid injections due to elevated A1c levels (8.2). Informed consent covered risks of injections, especially with diabetes, and the potential need for surgery if conservative measures fail. Refer to hand therapy for bilateral index and left ring finger trigger finger. Use Voltaren gel topically on affected fingers three to four times a day. Avoid oral NSAIDs due to potential kidney impact. Re-evaluate after a few weeks of hand therapy; consider corticosteroid injection if no improvement. Discuss potential need for surgery if conservative measures fail and blood sugar levels improve.  Type 2 diabetes mellitus with hyperglycemia  She has type 2 diabetes mellitus with a recent A1c of 8.2, indicating poor glycemic control. Current medications include metformin, pioglitazone (Actos), and Lantus insulin. Hyperglycemia is a concern for potential complications with trigger finger treatment. Encourage strict glycemic control to potentially improve trigger finger symptoms and reduce risks associated with corticosteroid injections. Diagnoses and all orders for this visit:  Trigger index finger of  left hand - Ambulatory Referral to Occupational Therapy  Trigger index finger of right hand - Ambulatory Referral to Occupational Therapy  Trigger ring finger of left hand - Ambulatory Referral to Occupational Therapy   PRECAUTIONS: None    WEIGHT  BEARING RESTRICTIONS: No  PAIN:  Are you having pain? 2-3/10 pain A1 pulleys L 4th   FALLS: Has patient fallen in last 6 months? No  LIVING ENVIRONMENT: Lives with: lives with their spouse   PLOF: Independent in basic ADLs and lighthouse activities. Patient mostly watching TV and on her phone.  And read a lot.  Do some laundry and cooking  PATIENT GOALS: Wants to get my trigger fingers better and avoid surgery.  NEXT MD VISIT:  4 wk after OT   OBJECTIVE:  Note: Objective measures were completed at Evaluation unless otherwise noted.  HAND DOMINANCE: Left  ADLs: Patient report increase of dropping objects.  As well as when gripping with fitting sheet or laundry or cutting food increased pain as well as locking of fingers in bilateral hands with the left worse than the right.   FUNCTIONAL OUTCOME MEASURES: Reassess next session  UPPER EXTREMITY ROM:   Wrist active range of motion in forearm within normal limits pain-free  Active ROM Right eval Left eval R 02/08/24  Thumb MCP (0-60)     Thumb IP (0-80)     Thumb Radial abd/add (0-55)      Thumb Palmar abd/add (0-45)      Thumb Opposition to Small Finger      Index MCP (0-90) 80 locking in am  80  locking every time   Index PIP (0-100) 100  90   Index DIP (0-70)       Long MCP (0-90)  85  80 locked this am    Long PIP (0-100)  100  90   Long DIP (0-70)       Ring MCP (0-90)  85  80 increase edema  90 PROM  Ring PIP (0-100)  100  90 90 PROM   Ring DIP (0-70)     70 PROM  Little MCP (0-90)  90  80   Little PIP (0-100)  100  90   Little DIP (0-70)       (Blank rows = not tested) Patient with bilateral thumb CMC pain.  HAND FUNCTION: Grip strength: Right: 40 lbs; Left: 21 lbs, Lateral pinch: Right: 6 lbs, Left: 5 lbs, and 3 point pinch: Right: 5 lbs, Left: 6 lbs pain in the palm of bilateral hands left worse than the right  COORDINATION: Within functional limits have a hard time because of her thumbs with buttons and  zippers and small objects  SENSATION: Patient wears a wrist brace at nighttime on the left hand report numbness at night if not wearing the brace  EDEMA: Left fourth digit  COGNITION: Overall cognitive status: Within functional limits for tasks assessed  TREATMENT DATE: 02/22/24   Subjective: No pain today at rest.  She feels her hand is less swollen and is able to start seeing wrinkles in her hand again.   Right hand is feeling really good, no tenderness at A1 pulley.  Left still has some mild tenderness.   Contrast: Time: 8 with  2 rotations of cold of 1 minute between, to decrease pain, inflammation and edema Location: bilateral hands  Decrease stiffness and pain prior to review of TherEX and manual therapy        Manual Therapy:  Pt seen  for manual tissue massage to mobilize tissue, decrease adhesions and to encourage lymphatic drainage by opening pathways. Use of Graston tool #2 for gentle brushing over volar palm and digits prior to range of motion to increase blood flow decrease pain and increased motion.  Therapeutic Exercises:   Following manual therapy, pt seen for exercises with focus on MCP flexion with PIP extension 10 reps Passive range of motion pain-free DIP/PIP flexion pain-free  Gentle passive range of motion composite flexion pain-free  Iontophoresis: Iontophoresis with dexamethasone with small patch over left fourth A1 pulley 2.0 current - patient tolerated well  -patient to keep patch on for about an hour afterwards. Skin inspected prior to treatment with no issues.  Performed ionto only to left hand this date, right with no tenderness or issues.  Pt to continue with current HEP  PATIENT EDUCATION: Education details: findings of eval and HEP  Person educated: Patient Education method: Explanation, Demonstration, Tactile cues, Verbal cues, and Handouts Education comprehension: verbalized understanding, returned demonstration, verbal cues required, and needs  further education     GOALS: Goals reviewed with patient? Yes  LONG TERM GOALS: Target date: 8 wks   Patient be independent home program of wearing splint as well as home exercises for modalities and modifications to decrease pain in the left hand less than 2/10. Baseline: Patient with pain 6/10 with tenderness over the A1 pulleys of the 2nd and 4th.  Increased edema at the fourth locking every attempt of flexion of the second.  Morning locking on the 3rd and 4th. Goal status: INITIAL  2.  Patient to verbalize decrease triggering of locking of bilateral hands to less than 3 times a day. Baseline: Left hand triggering in the morning on the 3rd and 4th.  Second every attempt of flexion.  Right index finger locking in the morning. Goal status: INITIAL  3.  Patient to verbalize 3-4 modifications or joint protection that she implemented to decrease triggering or locking of bilateral hands as well as decreasing pain Baseline: Patient no knowledge of modifications and joint protection. Goal status: INITIAL  4.  Left hand digit flexion improved to within normal limits pain-free and symptom-free. Baseline: Left MC flexion 80 degrees and PIP is 90 degrees.  With tenderness and pain 6/10. Goal status: INITIAL  5.  Right grip strength improved with more than 5 pounds for patient to be able to use left hand with less triggering and brushing her teeth, using a knife, using her hands and bathing and dressing. Baseline:  Goal status: INITIAL ASSESSMENT:  CLINICAL IMPRESSION: Patient seen for occupational therapy evaluation for bilateral hands with trigger fingers with the left worse than the right.  With a left index finger worse than the right index finger.  Increased edema in the 3rd and 4th digits with triggering at the the morning.  Patient pain 6/10 over the A1 pulleys at eval.  Patient with decreased grip strength on the left more than the right.  With decreased prehension strength.  Patient is  left-hand dominant.  Patient to continue to wear right second digit and left fourth digit MC block splint for second digits especially during functional activities.  Patient with decreased edema  and pain decrease overall at 4th L A1 pulley, none at rest and 1/10 with palpation at A1 pulley.  Pt demonstrates increased PROM at PIP/DIP and composite over the last week. Iontophoresis with dexamethasone for left fourth A1 pulley, no ionto to the right today. She is demonstrating improved functional use  of bilateral hands especially on the right and has continued to engage in functional tasks at home.  She has continued to incorporate recommendations on modifying tools and equipment at home for homemaking tasks. Continued progress in all areas. Patient can benefit from skilled OT services to decrease pain, edema and triggering and increased motion and strength to return to prior level of function to avoid having the shots or surgery.  Patient is diabetic.  PERFORMANCE DEFICITS: in functional skills including ADLs, IADLs, ROM, strength, pain, flexibility, decreased knowledge of use of DME, and UE functional use,   and psychosocial skills including environmental adaptation and routines and behaviors.   IMPAIRMENTS: are limiting patient from ADLs, IADLs, rest and sleep, play, leisure, and social participation.   COMORBIDITIES: has no other co-morbidities that affects occupational performance. Patient will benefit from skilled OT to address above impairments and improve overall function.  MODIFICATION OR ASSISTANCE TO COMPLETE EVALUATION: No modification of tasks or assist necessary to complete an evaluation.  OT OCCUPATIONAL PROFILE AND HISTORY: Problem focused assessment: Including review of records relating to presenting problem.  CLINICAL DECISION MAKING: LOW - limited treatment options, no task modification necessary  REHAB POTENTIAL: Good for goals  EVALUATION COMPLEXITY: Low   PLAN:  OT FREQUENCY:  1-2x/week  OT DURATION: 8 wks   PLANNED INTERVENTIONS: 97168 OT Re-evaluation, 97535 self care/ADL training, 02889 therapeutic exercise, 97530 therapeutic activity, 97112 neuromuscular re-education, 97140 manual therapy, 97035 ultrasound, 97018 paraffin, 02960 fluidotherapy, 97034 contrast bath, 97033 iontophoresis, 97760 Orthotic Initial, [id number] Orthotic/Prosthetic subsequent, patient/family education, and DME and/or AE instructions   CONSULTED AND AGREED WITH PLAN OF CARE: Patient   Amberly Livas, OTR/L,CLT 02/24/2024, 9:27 AM

## 2024-02-29 ENCOUNTER — Ambulatory Visit: Admitting: Occupational Therapy

## 2024-02-29 DIAGNOSIS — M25641 Stiffness of right hand, not elsewhere classified: Secondary | ICD-10-CM

## 2024-02-29 DIAGNOSIS — M6281 Muscle weakness (generalized): Secondary | ICD-10-CM

## 2024-02-29 DIAGNOSIS — M65322 Trigger finger, left index finger: Secondary | ICD-10-CM

## 2024-02-29 DIAGNOSIS — M65321 Trigger finger, right index finger: Secondary | ICD-10-CM

## 2024-02-29 DIAGNOSIS — M65342 Trigger finger, left ring finger: Secondary | ICD-10-CM

## 2024-02-29 NOTE — Therapy (Signed)
 OUTPATIENT OCCUPATIONAL THERAPY ORTHO TREATMENT  Patient Name: Kara Wood MRN: [id number] DOB:[birth year], 78 y.o., female Today's Date: 02/29/2024  PCP: Dr Auston REFERRING PROVIDER: Dr Kathlynn  END OF SESSION:  OT End of Session - 02/29/24 1402     Visit Number 11    Number of Visits 12    Date for OT Re-Evaluation 03/07/24    OT Start Time 1402    OT Stop Time 1440    OT Time Calculation (min) 38 min    Activity Tolerance Patient tolerated treatment well    Behavior During Therapy WFL for tasks assessed/performed           Past Medical History:  Diagnosis Date   Arthritis    Diabetes (HCC)    High cholesterol    Hypertension    Neuropathy in diabetes (HCC)    Stage 3 chronic kidney disease (HCC)    Past Surgical History:  Procedure Laterality Date   BACK SURGERY     CATARACT EXTRACTION W/PHACO Right 12/17/2014   Procedure: CATARACT EXTRACTION PHACO AND INTRAOCULAR LENS PLACEMENT (IOC);  Surgeon: Steven Dingeldein, MD;  Location: [facility] ORS;  Service: Ophthalmology;  Laterality: Right;  US  01:17 AP% 26.0 CDE 33.35   CHOLECYSTECTOMY     COLONOSCOPY WITH PROPOFOL  N/A 10/14/2023   Procedure: COLONOSCOPY WITH PROPOFOL ;  Surgeon: Onita Elspeth Sharper, DO;  Location: [facility] ENDOSCOPY;  Service: Gastroenterology;  Laterality: N/A;  DM   ESOPHAGOGASTRODUODENOSCOPY (EGD) WITH PROPOFOL  N/A 10/14/2023   Procedure: ESOPHAGOGASTRODUODENOSCOPY (EGD) WITH PROPOFOL ;  Surgeon: Onita Elspeth Sharper, DO;  Location: [facility] ENDOSCOPY;  Service: Gastroenterology;  Laterality: N/A;   POLYPECTOMY  10/14/2023   Procedure: POLYPECTOMY;  Surgeon: Onita Elspeth Sharper, DO;  Location: [facility] ENDOSCOPY;  Service: Gastroenterology;;   There are no active problems to display for this patient.   ONSET DATE: 6 months  REFERRING DIAG: Trigger fingers in bilateral hands   THERAPY DIAG:  Trigger index finger of left hand  Trigger finger, left ring finger  Trigger finger, right index finger  Muscle  weakness (generalized)  Stiffness of joints of both hands  Rationale for Evaluation and Treatment: Rehabilitation  SUBJECTIVE:   SUBJECTIVE STATEMENT: R hand is doing well - the L ring finger is doing better - index still triggering - did wear my splint less during day - no splint on R index Pt accompanied by: self  PERTINENT HISTORY: 12/20/23 Dr Kathlynn Note - Assessment/Plan:   Assessment & Plan Trigger finger  She has bilateral index finger trigger finger, with the left worse than the right, and the left ring finger is also affected but cannot flex to lock. Hyperglycemia likely exacerbates the condition. There are concerns about complications from corticosteroid injections due to elevated A1c levels (8.2). Informed consent covered risks of injections, especially with diabetes, and the potential need for surgery if conservative measures fail. Refer to hand therapy for bilateral index and left ring finger trigger finger. Use Voltaren gel topically on affected fingers three to four times a day. Avoid oral NSAIDs due to potential kidney impact. Re-evaluate after a few weeks of hand therapy; consider corticosteroid injection if no improvement. Discuss potential need for surgery if conservative measures fail and blood sugar levels improve.  Type 2 diabetes mellitus with hyperglycemia  She has type 2 diabetes mellitus with a recent A1c of 8.2, indicating poor glycemic control. Current medications include metformin, pioglitazone (Actos), and Lantus insulin. Hyperglycemia is a concern for potential complications with trigger finger treatment. Encourage strict glycemic control to  potentially improve trigger finger symptoms and reduce risks associated with corticosteroid injections. Diagnoses and all orders for this visit:  Trigger index finger of left hand - Ambulatory Referral to Occupational Therapy  Trigger index finger of right hand - Ambulatory Referral to Occupational Therapy  Trigger ring  finger of left hand - Ambulatory Referral to Occupational Therapy   PRECAUTIONS: None    WEIGHT BEARING RESTRICTIONS: No  PAIN:  Are you having pain? 4/10 pain A1 pulleys L 4th -  FALLS: Has patient fallen in last 6 months? No  LIVING ENVIRONMENT: Lives with: lives with their spouse   PLOF: Independent in basic ADLs and lighthouse activities. Patient mostly watching TV and on her phone.  And read a lot.  Do some laundry and cooking  PATIENT GOALS: Wants to get my trigger fingers better and avoid surgery.  NEXT MD VISIT:  4 wk after OT   OBJECTIVE:  Note: Objective measures were completed at Evaluation unless otherwise noted.  HAND DOMINANCE: Left  ADLs: Patient report increase of dropping objects.  As well as when gripping with fitting sheet or laundry or cutting food increased pain as well as locking of fingers in bilateral hands with the left worse than the right.   FUNCTIONAL OUTCOME MEASURES: Reassess next session  UPPER EXTREMITY ROM:   Wrist active range of motion in forearm within normal limits pain-free  Active ROM Right eval Left eval R 02/08/24 R/L 02/24/24  R 02/29/24  Thumb MCP (0-60)       Thumb IP (0-80)       Thumb Radial abd/add (0-55)        Thumb Palmar abd/add (0-45)        Thumb Opposition to Small Finger        Index MCP (0-90) 80 locking in am  80  locking every time  R No locking last few days / L locking today   Index PIP (0-100) 100  90     Index DIP (0-70)         Long MCP (0-90)  85  80 locked this am      Long PIP (0-100)  100  90     Long DIP (0-70)         Ring MCP (0-90)  85  80 increase edema  90 PROM 90 90  Ring PIP (0-100)  100  90 90 PROM  90 95 intrinsic  Ring DIP (0-70)     70 PROM 70 70  Little MCP (0-90)  90  80     Little PIP (0-100)  100  90     Little DIP (0-70)         (Blank rows = not tested) Patient with bilateral thumb CMC pain.  HAND FUNCTION: Grip strength: Right: 40 lbs; Left: 21 lbs, Lateral pinch: Right:  6 lbs, Left: 5 lbs, and 3 point pinch: Right: 5 lbs, Left: 6 lbs pain in the palm of bilateral hands left worse than the right 02/24/24: Grip strength: Right: 40 lbs; Left: 35 lbs, Lateral pinch: Right: 6 lbs, Left: 5 lbs, and 3 point pinch: Right: 7 lbs, Left: 6 lbs  COORDINATION: Within functional limits have a hard time because of her thumbs with buttons and zippers and small objects  SENSATION: Patient wears a wrist brace at nighttime on the left hand report numbness at night if not wearing the brace  EDEMA: Left fourth digit  COGNITION: Overall cognitive status: Within functional limits for tasks assessed  TREATMENT DATE: 02/29/24     Contrast: Time: 8 with  2 rotations of cold of 1 minute between, to decrease pain, inflammation and edema Location: bilateral hands  Decrease stiffness and pain prior to review of TherEX and manual therapy        Manual Therapy:  Pt seen for manual tissue massage to mobilize tissue, decrease adhesions and to encourage lymphatic drainage by opening pathways. Use of Graston tool #6 for gentle brushing over volar palm and digits prior to range of motion to increase blood flow decrease pain and increased motion.  Patient can do ice massage over left 2nd and 4th A1 pulley several times during the day Continue contrast 2-3 times a day Fitted with Isotoner glove for the left hand for nighttime.  Therapeutic Exercises:   Following manual therapy, pt seen for exercises with focus on MCP flexion with PIP extension 10 reps Passive range of motion pain-free DIP/PIP flexion pain-free  to 100 flexion at DIP and PIP 70  Gentle passive range of motion composite flexion pain-free REINFORCE   Able to touch palm today - and place and hold no triggering 3 reps  Iontophoresis: Iontophoresis with dexamethasone with small patch over left fourth A1 pulley 2.0 current - patient tolerated well  -patient to keep patch on for about an hour afterwards. Skin inspected prior to  treatment with no issues.  Performed ionto only to left hand this date, right with no tenderness or issues.  Pt to continue with current HEP  PATIENT EDUCATION: Education details: findings of eval and HEP  Person educated: Patient Education method: Explanation, Demonstration, Tactile cues, Verbal cues, and Handouts Education comprehension: verbalized understanding, returned demonstration, verbal cues required, and needs further education     GOALS: Goals reviewed with patient? Yes  LONG TERM GOALS: Target date: 8 wks   Patient be independent home program of wearing splint as well as home exercises for modalities and modifications to decrease pain in the left hand less than 2/10. Baseline: Patient with pain 6/10 with tenderness over the A1 pulleys of the 2nd and 4th.  Increased edema at the fourth locking every attempt of flexion of the second.  Morning locking on the 3rd and 4th. Goal status: INITIAL  2.  Patient to verbalize decrease triggering of locking of bilateral hands to less than 3 times a day. Baseline: Left hand triggering in the morning on the 3rd and 4th.  Second every attempt of flexion.  Right index finger locking in the morning. Goal status: INITIAL  3.  Patient to verbalize 3-4 modifications or joint protection that she implemented to decrease triggering or locking of bilateral hands as well as decreasing pain Baseline: Patient no knowledge of modifications and joint protection. Goal status: INITIAL  4.  Left hand digit flexion improved to within normal limits pain-free and symptom-free. Baseline: Left MC flexion 80 degrees and PIP is 90 degrees.  With tenderness and pain 6/10. Goal status: INITIAL  5.  Right grip strength improved with more than 5 pounds for patient to be able to use left hand with less triggering and brushing her teeth, using a knife, using her hands and bathing and dressing. Baseline:  Goal status: INITIAL ASSESSMENT:  CLINICAL  IMPRESSION: Patient seen for occupational therapy evaluation for bilateral hands with trigger fingers with the left worse than the right.  With a left index finger worse than the right index finger.  Increased edema in the 3rd and 4th digits with triggering at the the morning.  Patient pain 6/10 over the A1 pulleys at eval.  Patient with decreased grip strength on the left more than the right.  With decreased prehension strength.  Patient is left-hand dominant.  Patient to continue to wear right second digit and left fourth digit MC block splint for second digits especially during functional activities.  Patient with decreased edema  and pain decrease overall at 4th L A1 pulley, none at rest and 4/10 with palpation at A1 pulley.  Pt demonstrates increased PROM at PIP/DIP and composite over the 2-3 wks - able to do PROM to touch palm today and place and hold 3 reps no triggering - Iontophoresis with dexamethasone for left fourth A1 pulley, no ionto to the right today. She is demonstrating improved functional use of bilateral hands especially on the right and has continued to engage in functional tasks at home.  She has continued to incorporate recommendations on modifying tools and equipment at home for homemaking tasks. Continued progress in all areas. Patient can benefit from skilled OT services to decrease pain, edema and triggering and increased motion and strength to return to prior level of function to avoid having the shots or surgery.  Patient is diabetic.  PERFORMANCE DEFICITS: in functional skills including ADLs, IADLs, ROM, strength, pain, flexibility, decreased knowledge of use of DME, and UE functional use,   and psychosocial skills including environmental adaptation and routines and behaviors.   IMPAIRMENTS: are limiting patient from ADLs, IADLs, rest and sleep, play, leisure, and social participation.   COMORBIDITIES: has no other co-morbidities that affects occupational performance. Patient  will benefit from skilled OT to address above impairments and improve overall function.  MODIFICATION OR ASSISTANCE TO COMPLETE EVALUATION: No modification of tasks or assist necessary to complete an evaluation.  OT OCCUPATIONAL PROFILE AND HISTORY: Problem focused assessment: Including review of records relating to presenting problem.  CLINICAL DECISION MAKING: LOW - limited treatment options, no task modification necessary  REHAB POTENTIAL: Good for goals  EVALUATION COMPLEXITY: Low   PLAN:  OT FREQUENCY: 1-2x/week  OT DURATION: 8 wks   PLANNED INTERVENTIONS: 97168 OT Re-evaluation, 97535 self care/ADL training, 02889 therapeutic exercise, 97530 therapeutic activity, 97112 neuromuscular re-education, 97140 manual therapy, 97035 ultrasound, 97018 paraffin, 02960 fluidotherapy, 97034 contrast bath, 97033 iontophoresis, 97760 Orthotic Initial, [id number] Orthotic/Prosthetic subsequent, patient/family education, and DME and/or AE instructions   CONSULTED AND AGREED WITH PLAN OF CARE: Patient   Ancel Peters, OTR/L,CLT 02/29/2024, 2:41 PM

## 2024-03-02 ENCOUNTER — Ambulatory Visit: Admitting: Occupational Therapy

## 2024-03-02 DIAGNOSIS — M65322 Trigger finger, left index finger: Secondary | ICD-10-CM

## 2024-03-02 DIAGNOSIS — M65342 Trigger finger, left ring finger: Secondary | ICD-10-CM

## 2024-03-02 DIAGNOSIS — M25641 Stiffness of right hand, not elsewhere classified: Secondary | ICD-10-CM

## 2024-03-02 DIAGNOSIS — M65321 Trigger finger, right index finger: Secondary | ICD-10-CM

## 2024-03-02 DIAGNOSIS — M6281 Muscle weakness (generalized): Secondary | ICD-10-CM

## 2024-03-02 NOTE — Therapy (Signed)
 OUTPATIENT OCCUPATIONAL THERAPY ORTHO TREATMENT  Patient Name: Kara Wood MRN: [id number] DOB:Feb 15, 1946, 78 y.o., female Today's Date: 03/02/2024  PCP: Dr Auston REFERRING PROVIDER: Dr Kathlynn  END OF SESSION:     Past Medical History:  Diagnosis Date   Arthritis    Diabetes (HCC)    High cholesterol    Hypertension    Neuropathy in diabetes [facility])    Stage 3 chronic kidney disease [facility])    Past Surgical History:  Procedure Laterality Date   BACK SURGERY     CATARACT EXTRACTION W/PHACO Right 12/17/2014   Procedure: CATARACT EXTRACTION PHACO AND INTRAOCULAR LENS PLACEMENT (IOC);  Surgeon: Steven Dingeldein, MD;  Location: [facility] ORS;  Service: Ophthalmology;  Laterality: Right;  US  01:17 AP% 26.0 CDE 33.35   CHOLECYSTECTOMY     COLONOSCOPY WITH PROPOFOL  N/A 10/14/2023   Procedure: COLONOSCOPY WITH PROPOFOL ;  Surgeon: Onita Elspeth Sharper, DO;  Location: [facility] ENDOSCOPY;  Service: Gastroenterology;  Laterality: N/A;  DM   ESOPHAGOGASTRODUODENOSCOPY (EGD) WITH PROPOFOL  N/A 10/14/2023   Procedure: ESOPHAGOGASTRODUODENOSCOPY (EGD) WITH PROPOFOL ;  Surgeon: Onita Elspeth Sharper, DO;  Location: [facility] ENDOSCOPY;  Service: Gastroenterology;  Laterality: N/A;   POLYPECTOMY  10/14/2023   Procedure: POLYPECTOMY;  Surgeon: Onita Elspeth Sharper, DO;  Location: [facility] ENDOSCOPY;  Service: Gastroenterology;;   There are no active problems to display for this patient.   ONSET DATE: 6 months  REFERRING DIAG: Trigger fingers in bilateral hands   THERAPY DIAG:  No diagnosis found.  Rationale for Evaluation and Treatment: Rehabilitation  SUBJECTIVE:   SUBJECTIVE STATEMENT: R hand is doing well - the L ring finger is doing better - index still triggering on the L since last week - did wear my splint less during day - no splint on R index and no triggering Pt accompanied by: self  PERTINENT HISTORY: 12/20/23 Dr Kathlynn Note - Assessment/Plan:   Assessment & Plan Trigger finger  She has bilateral  index finger trigger finger, with the left worse than the right, and the left ring finger is also affected but cannot flex to lock. Hyperglycemia likely exacerbates the condition. There are concerns about complications from corticosteroid injections due to elevated A1c levels (8.2). Informed consent covered risks of injections, especially with diabetes, and the potential need for surgery if conservative measures fail. Refer to hand therapy for bilateral index and left ring finger trigger finger. Use Voltaren gel topically on affected fingers three to four times a day. Avoid oral NSAIDs due to potential kidney impact. Re-evaluate after a few weeks of hand therapy; consider corticosteroid injection if no improvement. Discuss potential need for surgery if conservative measures fail and blood sugar levels improve.  Type 2 diabetes mellitus with hyperglycemia  She has type 2 diabetes mellitus with a recent A1c of 8.2, indicating poor glycemic control. Current medications include metformin, pioglitazone (Actos), and Lantus insulin. Hyperglycemia is a concern for potential complications with trigger finger treatment. Encourage strict glycemic control to potentially improve trigger finger symptoms and reduce risks associated with corticosteroid injections. Diagnoses and all orders for this visit:  Trigger index finger of left hand - Ambulatory Referral to Occupational Therapy  Trigger index finger of right hand - Ambulatory Referral to Occupational Therapy  Trigger ring finger of left hand - Ambulatory Referral to Occupational Therapy   PRECAUTIONS: None    WEIGHT BEARING RESTRICTIONS: No  PAIN:  Are you having pain? 2/10 pain A1 pulleys L 4th -  FALLS: Has patient fallen in last 6 months? No  LIVING  ENVIRONMENT: Lives with: lives with their spouse   PLOF: Independent in basic ADLs and lighthouse activities. Patient mostly watching TV and on her phone.  And read a lot.  Do some laundry and  cooking  PATIENT GOALS: Wants to get my trigger fingers better and avoid surgery.  NEXT MD VISIT:  4 wk after OT   OBJECTIVE:  Note: Objective measures were completed at Evaluation unless otherwise noted.  HAND DOMINANCE: Left  ADLs: Patient report increase of dropping objects.  As well as when gripping with fitting sheet or laundry or cutting food increased pain as well as locking of fingers in bilateral hands with the left worse than the right.   FUNCTIONAL OUTCOME MEASURES: Reassess next session  UPPER EXTREMITY ROM:   Wrist active range of motion in forearm within normal limits pain-free  Active ROM Right eval Left eval R 02/08/24 R/L 02/24/24  L 02/29/24 L 03/02/24 PROM  Thumb MCP (0-60)        Thumb IP (0-80)        Thumb Radial abd/add (0-55)         Thumb Palmar abd/add (0-45)         Thumb Opposition to Small Finger         Index MCP (0-90) 80 locking in am  80  locking every time  R No locking last few days / L locking today    Index PIP (0-100) 100  90      Index DIP (0-70)          Long MCP (0-90)  85  80 locked this am       Long PIP (0-100)  100  90      Long DIP (0-70)          Ring MCP (0-90)  85  80 increase edema  90 PROM 90 90 90  Ring PIP (0-100)  100  90 90 PROM  90 95 intrinsic 100  Ring DIP (0-70)     70 PROM 70 70 70  Little MCP (0-90)  90  80      Little PIP (0-100)  100  90      Little DIP (0-70)          (Blank rows = not tested) Patient with bilateral thumb CMC pain.  HAND FUNCTION: Grip strength: Right: 40 lbs; Left: 21 lbs, Lateral pinch: Right: 6 lbs, Left: 5 lbs, and 3 point pinch: Right: 5 lbs, Left: 6 lbs pain in the palm of bilateral hands left worse than the right 02/24/24: Grip strength: Right: 40 lbs; Left: 35 lbs, Lateral pinch: Right: 6 lbs, Left: 5 lbs, and 3 point pinch: Right: 7 lbs, Left: 6 lbs 03/02/24: Grip strength: Right: 40 lbs; Left: 35 lbs, Lateral pinch: Right: 7 lbs, Left: 6 lbs, and 3 point pinch: Right: 6 lbs, Left: 5  lbs  COORDINATION: Within functional limits have a hard time because of her thumbs with buttons and zippers and small objects  SENSATION: Patient wears a wrist brace at nighttime on the left hand report numbness at night if not wearing the brace  EDEMA: Left fourth digit  COGNITION: Overall cognitive status: Within functional limits for tasks assessed  TREATMENT DATE: 03/02/24    PROM WNL at 4th R digit - touching palm No pain  Tenderness decrease at 4th 2/10   AROM 3/4 of range no triggering in session Pain when attempt to touch palm    FLuidotherapy Time: 8 with  1 rotation of cold of 1 minute between, to decrease pain, inflammation and edema Location: bilateral hands  Decrease stiffness and pain prior to review of TherEX and manual therapy        Manual Therapy:  Pt seen for manual tissue massage to mobilize tissue, decrease adhesions and to encourage lymphatic drainage by opening pathways. Use of Graston tool #6 for gentle brushing over volar palm and digits prior to range of motion to increase blood flow decrease pain and increased motion.  Patient can do ice massage over left 2nd and 4th A1 pulley several times during the day Continue contrast 2-3 times a day Fitted with Isotoner glove for the left hand for nighttime. Silicone digit sleeve for daytime use on fourth digit.  Decreased pain at PIP as well as edema  Therapeutic Exercises:   Following manual therapy, pt seen for exercises with focus on MCP flexion with PIP extension 10 reps Passive range of motion pain-free DIP/PIP flexion pain-free  to 100 flexion at DIP and PIP 70  Gentle passive range of motion composite flexion pain-free REINFORCE   Able to touch palm today - and place and hold no triggering 3 reps WNL PROM this date   Iontophoresis: Iontophoresis with dexamethasone with small patch over left fourth A1 pulley 2.0 current - patient tolerated well  -patient to keep patch on for about an hour afterwards.  Skin inspected prior to treatment with no issues.  Performed ionto only to left hand this date, right with no tenderness or issues.  Pt to continue with current HEP  PATIENT EDUCATION: Education details: findings of eval and HEP  Person educated: Patient Education method: Explanation, Demonstration, Tactile cues, Verbal cues, and Handouts Education comprehension: verbalized understanding, returned demonstration, verbal cues required, and needs further education     GOALS: Goals reviewed with patient? Yes  LONG TERM GOALS: Target date: 8 wks   Patient be independent home program of wearing splint as well as home exercises for modalities and modifications to decrease pain in the left hand less than 2/10. Baseline: Patient with pain 6/10 with tenderness over the A1 pulleys of the 2nd and 4th.  Increased edema at the fourth locking every attempt of flexion of the second.  Morning locking on the 3rd and 4th. Goal status: INITIAL  2.  Patient to verbalize decrease triggering of locking of bilateral hands to less than 3 times a day. Baseline: Left hand triggering in the morning on the 3rd and 4th.  Second every attempt of flexion.  Right index finger locking in the morning. Goal status: INITIAL  3.  Patient to verbalize 3-4 modifications or joint protection that she implemented to decrease triggering or locking of bilateral hands as well as decreasing pain Baseline: Patient no knowledge of modifications and joint protection. Goal status: INITIAL  4.  Left hand digit flexion improved to within normal limits pain-free and symptom-free. Baseline: Left MC flexion 80 degrees and PIP is 90 degrees.  With tenderness and pain 6/10. Goal status: INITIAL  5.  Right grip strength improved with more than 5 pounds for patient to be able to use left hand with less triggering and brushing her teeth, using a knife, using her hands and bathing and dressing. Baseline:  Goal status:  INITIAL ASSESSMENT:  CLINICAL IMPRESSION: Patient seen for occupational therapy evaluation for bilateral hands with trigger fingers with the left worse than the right.  With a left index finger worse than the right index finger.  Increased edema in  the 3rd and 4th digits with triggering at the the morning.  Patient pain 6/10 over the A1 pulleys at eval.  Patient with decreased grip strength on the left more than the right.  With decreased prehension strength.  Patient is left-hand dominant.  Patient to continue to wear right second digit and left fourth digit MC block splint for second digits especially during functional activities.  Patient with decreased edema  and pain decrease overall at 4th L A1 pulley, none at rest and 4/10 with palpation at A1 pulley.  Pt demonstrates increased PROM at PIP/DIP and composite over the 2-3 wks - able to do PROM to touch palm today and place and hold 3 reps no triggering - Iontophoresis with dexamethasone for left fourth A1 pulley, no ionto to the right today. She is demonstrating improved functional use of bilateral hands especially on the right and has continued to engage in functional tasks at home.  She has continued to incorporate recommendations on modifying tools and equipment at home for homemaking tasks. Continued progress in all areas. Patient can benefit from skilled OT services to decrease pain, edema and triggering and increased motion and strength to return to prior level of function to avoid having the shots or surgery.  Patient is diabetic.  PERFORMANCE DEFICITS: in functional skills including ADLs, IADLs, ROM, strength, pain, flexibility, decreased knowledge of use of DME, and UE functional use,   and psychosocial skills including environmental adaptation and routines and behaviors.   IMPAIRMENTS: are limiting patient from ADLs, IADLs, rest and sleep, play, leisure, and social participation.   COMORBIDITIES: has no other co-morbidities that affects  occupational performance. Patient will benefit from skilled OT to address above impairments and improve overall function.  MODIFICATION OR ASSISTANCE TO COMPLETE EVALUATION: No modification of tasks or assist necessary to complete an evaluation.  OT OCCUPATIONAL PROFILE AND HISTORY: Problem focused assessment: Including review of records relating to presenting problem.  CLINICAL DECISION MAKING: LOW - limited treatment options, no task modification necessary  REHAB POTENTIAL: Good for goals  EVALUATION COMPLEXITY: Low   PLAN:  OT FREQUENCY: 1-2x/week  OT DURATION: 8 wks   PLANNED INTERVENTIONS: 97168 OT Re-evaluation, 97535 self care/ADL training, 02889 therapeutic exercise, 97530 therapeutic activity, 97112 neuromuscular re-education, 97140 manual therapy, 97035 ultrasound, 97018 paraffin, 02960 fluidotherapy, 97034 contrast bath, 97033 iontophoresis, 97760 Orthotic Initial, [id number] Orthotic/Prosthetic subsequent, patient/family education, and DME and/or AE instructions   CONSULTED AND AGREED WITH PLAN OF CARE: Patient   Ancel Peters, OTR/L,CLT 03/02/2024, 11:24 AM

## 2024-03-07 ENCOUNTER — Ambulatory Visit: Admitting: Occupational Therapy

## 2024-03-07 DIAGNOSIS — M65322 Trigger finger, left index finger: Secondary | ICD-10-CM | POA: Diagnosis not present

## 2024-03-07 DIAGNOSIS — M25641 Stiffness of right hand, not elsewhere classified: Secondary | ICD-10-CM

## 2024-03-07 DIAGNOSIS — M65321 Trigger finger, right index finger: Secondary | ICD-10-CM

## 2024-03-07 DIAGNOSIS — M65342 Trigger finger, left ring finger: Secondary | ICD-10-CM

## 2024-03-07 DIAGNOSIS — M6281 Muscle weakness (generalized): Secondary | ICD-10-CM

## 2024-03-07 NOTE — Therapy (Signed)
 OUTPATIENT OCCUPATIONAL THERAPY ORTHO TREATMENT/RECERT  Patient Name: Kara Wood MRN: [id number] DOB:[birth year], 78 y.o., female Today's Date: 03/07/2024  PCP: Dr Auston REFERRING PROVIDER: Dr Kathlynn  END OF SESSION:  OT End of Session - 03/07/24 1356     Visit Number 13    Number of Visits 18    Date for OT Re-Evaluation 04/18/24    OT Start Time 1400    OT Stop Time 1455    OT Time Calculation (min) 55 min    Activity Tolerance Patient tolerated treatment well    Behavior During Therapy WFL for tasks assessed/performed            Past Medical History:  Diagnosis Date   Arthritis    Diabetes (HCC)    High cholesterol    Hypertension    Neuropathy in diabetes (HCC)    Stage 3 chronic kidney disease (HCC)    Past Surgical History:  Procedure Laterality Date   BACK SURGERY     CATARACT EXTRACTION W/PHACO Right 12/17/2014   Procedure: CATARACT EXTRACTION PHACO AND INTRAOCULAR LENS PLACEMENT (IOC);  Surgeon: Steven Dingeldein, MD;  Location: [facility] ORS;  Service: Ophthalmology;  Laterality: Right;  US  01:17 AP% 26.0 CDE 33.35   CHOLECYSTECTOMY     COLONOSCOPY WITH PROPOFOL  N/A 10/14/2023   Procedure: COLONOSCOPY WITH PROPOFOL ;  Surgeon: Onita Elspeth Sharper, DO;  Location: [facility] ENDOSCOPY;  Service: Gastroenterology;  Laterality: N/A;  DM   ESOPHAGOGASTRODUODENOSCOPY (EGD) WITH PROPOFOL  N/A 10/14/2023   Procedure: ESOPHAGOGASTRODUODENOSCOPY (EGD) WITH PROPOFOL ;  Surgeon: Onita Elspeth Sharper, DO;  Location: [facility] ENDOSCOPY;  Service: Gastroenterology;  Laterality: N/A;   POLYPECTOMY  10/14/2023   Procedure: POLYPECTOMY;  Surgeon: Onita Elspeth Sharper, DO;  Location: [facility] ENDOSCOPY;  Service: Gastroenterology;;   There are no active problems to display for this patient.   ONSET DATE: 6 months  REFERRING DIAG: Trigger fingers in bilateral hands   THERAPY DIAG:  Trigger index finger of left hand  Trigger finger, left ring finger  Trigger finger, right index  finger  Stiffness of joints of both hands  Muscle weakness (generalized)  Rationale for Evaluation and Treatment: Rehabilitation  SUBJECTIVE:   SUBJECTIVE STATEMENT: The right hand is doing well.  Not triggering.  The left hand the ring finger still hurts.  When I make a fist.  In the index finger and the ring finger has not been triggering for the last few days.   Pt accompanied by: self  PERTINENT HISTORY: 12/20/23 Dr Kathlynn Note - Assessment/Plan:   Assessment & Plan Trigger finger  She has bilateral index finger trigger finger, with the left worse than the right, and the left ring finger is also affected but cannot flex to lock. Hyperglycemia likely exacerbates the condition. There are concerns about complications from corticosteroid injections due to elevated A1c levels (8.2). Informed consent covered risks of injections, especially with diabetes, and the potential need for surgery if conservative measures fail. Refer to hand therapy for bilateral index and left ring finger trigger finger. Use Voltaren gel topically on affected fingers three to four times a day. Avoid oral NSAIDs due to potential kidney impact. Re-evaluate after a few weeks of hand therapy; consider corticosteroid injection if no improvement. Discuss potential need for surgery if conservative measures fail and blood sugar levels improve.  Type 2 diabetes mellitus with hyperglycemia  She has type 2 diabetes mellitus with a recent A1c of 8.2, indicating poor glycemic control. Current medications include metformin, pioglitazone (Actos), and Lantus insulin. Hyperglycemia is  a concern for potential complications with trigger finger treatment. Encourage strict glycemic control to potentially improve trigger finger symptoms and reduce risks associated with corticosteroid injections. Diagnoses and all orders for this visit:  Trigger index finger of left hand - Ambulatory Referral to Occupational Therapy  Trigger index finger of  right hand - Ambulatory Referral to Occupational Therapy  Trigger ring finger of left hand - Ambulatory Referral to Occupational Therapy   PRECAUTIONS: None    WEIGHT BEARING RESTRICTIONS: No  PAIN:  Are you having pain? 3-4/10 fourth digit with flexion and over A1 pulley  FALLS: Has patient fallen in last 6 months? No  LIVING ENVIRONMENT: Lives with: lives with their spouse   PLOF: Independent in basic ADLs and lighthouse activities. Patient mostly watching TV and on her phone.  And read a lot.  Do some laundry and cooking  PATIENT GOALS: Wants to get my trigger fingers better and avoid surgery.  NEXT MD VISIT:  4 wk after OT   OBJECTIVE:  Note: Objective measures were completed at Evaluation unless otherwise noted.  HAND DOMINANCE: Left  ADLs: Patient report increase of dropping objects.  As well as when gripping with fitting sheet or laundry or cutting food increased pain as well as locking of fingers in bilateral hands with the left worse than the right.   FUNCTIONAL OUTCOME MEASURES: Reassess next session  UPPER EXTREMITY ROM:   Wrist active range of motion in forearm within normal limits pain-free  Active ROM Right eval Left eval R 02/08/24 R/L 02/24/24  L 02/29/24 L 03/02/24 PROM  Thumb MCP (0-60)        Thumb IP (0-80)        Thumb Radial abd/add (0-55)         Thumb Palmar abd/add (0-45)         Thumb Opposition to Small Finger         Index MCP (0-90) 80 locking in am  80  locking every time  R No locking last few days / L locking today    Index PIP (0-100) 100  90      Index DIP (0-70)          Long MCP (0-90)  85  80 locked this am       Long PIP (0-100)  100  90      Long DIP (0-70)          Ring MCP (0-90)  85  80 increase edema  90 PROM 90 90 90  Ring PIP (0-100)  100  90 90 PROM  90 95 intrinsic 100  Ring DIP (0-70)     70 PROM 70 70 70  Little MCP (0-90)  90  80      Little PIP (0-100)  100  90      Little DIP (0-70)          (Blank rows  = not tested) Patient with bilateral thumb CMC pain.  HAND FUNCTION: Grip strength: Right: 40 lbs; Left: 21 lbs, Lateral pinch: Right: 6 lbs, Left: 5 lbs, and 3 point pinch: Right: 5 lbs, Left: 6 lbs pain in the palm of bilateral hands left worse than the right 02/24/24: Grip strength: Right: 40 lbs; Left: 35 lbs, Lateral pinch: Right: 6 lbs, Left: 5 lbs, and 3 point pinch: Right: 7 lbs, Left: 6 lbs 03/02/24: Grip strength: Right: 40 lbs; Left: 35 lbs, Lateral pinch: Right: 7 lbs, Left: 6 lbs, and 3 point pinch: Right:  6 lbs, Left: 5 lbs  COORDINATION: Within functional limits have a hard time because of her thumbs with buttons and zippers and small objects  SENSATION: Patient wears a wrist brace at nighttime on the left hand report numbness at night if not wearing the brace  EDEMA: Left fourth digit  COGNITION: Overall cognitive status: Within functional limits for tasks assessed  TREATMENT DATE: 03/07/24    Date extended reviewing with patient anatomy of trigger finger the anatomy of the flexor extensor tendons. Importance of performing some moist heat or contrast in the morning prior to PROM to fourth digit DIP/PIP flexion as well as composite flexion. Reviewed with patient again composite PROM flexion prior to active range of motion to decrease pain with active range of motion of fourth digit  PROM WNL at 4th R digit - touching palm No pain  Tenderness decrease at 4th 3-4/10  Placing hold within normal limits no pain  Active range of motion composite flexion 3-4/10 in fourth digit.      Contrast Time: 8 with  Location: Left hand Decrease stiffness and pain prior to review of PROM to right second and fourth digit       Manual Therapy:  Pt seen for manual tissue massage to mobilize tissue, decrease adhesions and to encourage lymphatic drainage by opening pathways. Use of Graston tool #6 for gentle brushing over volar palm and digits prior to range of motion to increase blood flow  decrease pain and increased motion.  Patient can do ice massage over left 2nd and 4th A1 pulley several times during the day Continue contrast 2-3 times a day Isotoner glove for the left hand for nighttime. Silicone digit sleeve for daytime use on fourth digit.  Decreased pain at PIP as well as edema  Therapeutic Exercises:   Following manual therapy,  Passive range of motion pain-free DIP/PIP flexion pain-free  100 and 70 degrees  passive range of motion composite flexion pain-free REINFORCE   Able to touch palm today - and place and hold no triggering  Passive range of motion within normal limits this date  Iontophoresis: Iontophoresis with dexamethasone with small patch over left fourth A1 pulley 2.0 current - patient tolerated well  -patient to keep patch on for about an hour afterwards. Skin inspected prior to treatment with no issues.  Performed ionto only to left hand this date, right with no tenderness or issues.  Pt to continue with current HEP  PATIENT EDUCATION: Education details: findings of eval and HEP  Person educated: Patient Education method: Explanation, Demonstration, Tactile cues, Verbal cues, and Handouts Education comprehension: verbalized understanding, returned demonstration, verbal cues required, and needs further education     GOALS: Goals reviewed with patient? Yes  LONG TERM GOALS: Target date: 8 wks   Patient be independent home program of wearing splint as well as home exercises for modalities and modifications to decrease pain in the left hand less than 2/10. Baseline: Patient with pain 6/10 with tenderness over the A1 pulleys of the 2nd and 4th.  Increased edema at the fourth locking every attempt of flexion of the second.  Morning locking on the 3rd and 4th. Goal status: Met  2.  Patient to verbalize decrease triggering of locking of bilateral hands to less than 3 times a day. Baseline: Left hand triggering in the morning on the 3rd and 4th.   Second every attempt of flexion.  Right index finger locking in the morning. Goal status: Met  3.  Patient to  verbalize 3-4 modifications or joint protection that she implemented to decrease triggering or locking of bilateral hands as well as decreasing pain Baseline: Patient no knowledge of modifications and joint protection. Goal status: Met  4.  Left hand digit flexion improved to within normal limits pain-free and symptom-free. Baseline: Left MC flexion 80 degrees and PIP is 90 degrees.  With tenderness and pain 6/10. Goal status: Progressing  5.  Grip strength improved with more than 5 pounds for patient to be able to use left hand with less triggering and brushing her teeth, using a knife, using her hands and bathing and dressing. Baseline:  Goal status: Progressing ASSESSMENT:  CLINICAL IMPRESSION: Patient seen for occupational therapy evaluation for bilateral hands with trigger fingers with the left worse than the right.  With a left index finger worse than the right index finger.  Increased edema in the 3rd and 4th digits with triggering at the the morning.  Patient pain 6/10 over the A1 pulleys at eval.  NOW patient pain at rest decreased to 0/10.  Patient no triggering on the right hand.  Patient reports no triggering last few days in left hand.  Patient report 3-4/10 pain with active range of motion of fourth digit as well as over the A1 pulley.  Patient's passive range of motion and fourth digit within normal limits after reviewing of home exercises for PIP/DIP flexion followed by composite flexion.  Reviewed with patient and anatomy of trigger finger as well as the importance of doing composite flexion prior to attempts of active range of motion.  Has to perform it a few times during the day.  Continue to do contrast and ice massage to decrease discomfort A1 pulley.  Iontophoresis with dexamethasone for left fourth A1 pulley, no ionto to the right today. She is demonstrating improved  functional use of bilateral hands especially on the right and has continued to engage in functional tasks at home.  She has continued to incorporate recommendations on modifying tools and equipment at home for homemaking tasks.  Patient decreased to 1 time a week.  Patient has appointment coming up with PCP in August for blood work.  Continued progress in all areas. Patient can benefit from skilled OT services to decrease pain, edema and triggering and increased motion and strength to return to prior level of function to avoid having the shots or surgery.  Patient is diabetic.  PERFORMANCE DEFICITS: in functional skills including ADLs, IADLs, ROM, strength, pain, flexibility, decreased knowledge of use of DME, and UE functional use,   and psychosocial skills including environmental adaptation and routines and behaviors.   IMPAIRMENTS: are limiting patient from ADLs, IADLs, rest and sleep, play, leisure, and social participation.   COMORBIDITIES: has no other co-morbidities that affects occupational performance. Patient will benefit from skilled OT to address above impairments and improve overall function.  MODIFICATION OR ASSISTANCE TO COMPLETE EVALUATION: No modification of tasks or assist necessary to complete an evaluation.  OT OCCUPATIONAL PROFILE AND HISTORY: Problem focused assessment: Including review of records relating to presenting problem.  CLINICAL DECISION MAKING: LOW - limited treatment options, no task modification necessary  REHAB POTENTIAL: Good for goals  EVALUATION COMPLEXITY: Low   PLAN:  OT FREQUENCY: 1-2x/week  OT DURATION: 8 wks   PLANNED INTERVENTIONS: 97168 OT Re-evaluation, 97535 self care/ADL training, 02889 therapeutic exercise, 97530 therapeutic activity, 97112 neuromuscular re-education, 97140 manual therapy, 97035 ultrasound, 97018 paraffin, 02960 fluidotherapy, 97034 contrast bath, 97033 iontophoresis, 97760 Orthotic Initial, [id number] Orthotic/Prosthetic  subsequent,  patient/family education, and DME and/or AE instructions   CONSULTED AND AGREED WITH PLAN OF CARE: Patient   Ancel Peters, OTR/L,CLT 03/07/2024, 4:05 PM

## 2024-03-09 ENCOUNTER — Encounter: Admitting: Occupational Therapy

## 2024-03-14 ENCOUNTER — Ambulatory Visit: Attending: Orthopedic Surgery | Admitting: Occupational Therapy

## 2024-03-14 DIAGNOSIS — M25641 Stiffness of right hand, not elsewhere classified: Secondary | ICD-10-CM | POA: Diagnosis present

## 2024-03-14 DIAGNOSIS — M65342 Trigger finger, left ring finger: Secondary | ICD-10-CM | POA: Diagnosis present

## 2024-03-14 DIAGNOSIS — M25642 Stiffness of left hand, not elsewhere classified: Secondary | ICD-10-CM | POA: Insufficient documentation

## 2024-03-14 DIAGNOSIS — M65322 Trigger finger, left index finger: Secondary | ICD-10-CM | POA: Insufficient documentation

## 2024-03-14 DIAGNOSIS — M65321 Trigger finger, right index finger: Secondary | ICD-10-CM | POA: Insufficient documentation

## 2024-03-14 DIAGNOSIS — M6281 Muscle weakness (generalized): Secondary | ICD-10-CM | POA: Insufficient documentation

## 2024-03-14 NOTE — Therapy (Signed)
 OUTPATIENT OCCUPATIONAL THERAPY ORTHO TREATMENT  Patient Name: Kara Wood MRN: [id number] DOB:1946-06-07, 78 y.o., female Today's Date: 03/14/2024  PCP: Dr Auston REFERRING PROVIDER: Dr Kathlynn  END OF SESSION:  OT End of Session - 03/14/24 1117     Visit Number 14    Number of Visits 18    Date for OT Re-Evaluation 04/18/24    OT Start Time 1118    OT Stop Time 1205    OT Time Calculation (min) 47 min    Activity Tolerance Patient tolerated treatment well    Behavior During Therapy WFL for tasks assessed/performed            Past Medical History:  Diagnosis Date   Arthritis    Diabetes (HCC)    High cholesterol    Hypertension    Neuropathy in diabetes (HCC)    Stage 3 chronic kidney disease (HCC)    Past Surgical History:  Procedure Laterality Date   BACK SURGERY     CATARACT EXTRACTION W/PHACO Right 12/17/2014   Procedure: CATARACT EXTRACTION PHACO AND INTRAOCULAR LENS PLACEMENT (IOC);  Surgeon: Steven Dingeldein, MD;  Location: [facility] ORS;  Service: Ophthalmology;  Laterality: Right;  US  01:17 AP% 26.0 CDE 33.35   CHOLECYSTECTOMY     COLONOSCOPY WITH PROPOFOL  N/A 10/14/2023   Procedure: COLONOSCOPY WITH PROPOFOL ;  Surgeon: Onita Elspeth Sharper, DO;  Location: [facility]COPY;  Service: Gastroenterology;  Laterality: N/A;  DM   ESOPHAGOGASTRODUODENOSCOPY (EGD) WITH PROPOFOL  N/A 10/14/2023   Procedure: ESOPHAGOGASTRODUODENOSCOPY (EGD) WITH PROPOFOL ;  Surgeon: Onita Elspeth Sharper, DO;  Location: [facility] ENDOSCOPY;  Service: Gastroenterology;  Laterality: N/A;   POLYPECTOMY  10/14/2023   Procedure: POLYPECTOMY;  Surgeon: Onita Elspeth Sharper, DO;  Location: [facility] ENDOSCOPY;  Service: Gastroenterology;;   There are no active problems to display for this patient.   ONSET DATE: 6 months  REFERRING DIAG: Trigger fingers in bilateral hands   THERAPY DIAG:  Trigger index finger of left hand  Trigger finger, left ring finger  Trigger finger, right index finger  Stiffness  of joints of both hands  Muscle weakness (generalized)  Rationale for Evaluation and Treatment: Rehabilitation  SUBJECTIVE:   SUBJECTIVE STATEMENT: The right hand is doing well.  Not triggering.  The left hand the ring finger still hurts.  When I make a fist.  In the index finger and the ring finger has not been triggering for the last few days.   Pt accompanied by: self  PERTINENT HISTORY: 12/20/23 Dr Kathlynn Note - Assessment/Plan:   Assessment & Plan Trigger finger  She has bilateral index finger trigger finger, with the left worse than the right, and the left ring finger is also affected but cannot flex to lock. Hyperglycemia likely exacerbates the condition. There are concerns about complications from corticosteroid injections due to elevated A1c levels (8.2). Informed consent covered risks of injections, especially with diabetes, and the potential need for surgery if conservative measures fail. Refer to hand therapy for bilateral index and left ring finger trigger finger. Use Voltaren gel topically on affected fingers three to four times a day. Avoid oral NSAIDs due to potential kidney impact. Re-evaluate after a few weeks of hand therapy; consider corticosteroid injection if no improvement. Discuss potential need for surgery if conservative measures fail and blood sugar levels improve.  Type 2 diabetes mellitus with hyperglycemia  She has type 2 diabetes mellitus with a recent A1c of 8.2, indicating poor glycemic control. Current medications include metformin, pioglitazone (Actos), and Lantus insulin. Hyperglycemia is  a concern for potential complications with trigger finger treatment. Encourage strict glycemic control to potentially improve trigger finger symptoms and reduce risks associated with corticosteroid injections. Diagnoses and all orders for this visit:  Trigger index finger of left hand - Ambulatory Referral to Occupational Therapy  Trigger index finger of right hand -  Ambulatory Referral to Occupational Therapy  Trigger ring finger of left hand - Ambulatory Referral to Occupational Therapy   PRECAUTIONS: None    WEIGHT BEARING RESTRICTIONS: No  PAIN:  Are you having pain? 3-4/10 fourth digit with flexion and over A1 pulley  FALLS: Has patient fallen in last 6 months? No  LIVING ENVIRONMENT: Lives with: lives with their spouse   PLOF: Independent in basic ADLs and lighthouse activities. Patient mostly watching TV and on her phone.  And read a lot.  Do some laundry and cooking  PATIENT GOALS: Wants to get my trigger fingers better and avoid surgery.  NEXT MD VISIT:  4 wk after OT   OBJECTIVE:  Note: Objective measures were completed at Evaluation unless otherwise noted.  HAND DOMINANCE: Left  ADLs: Patient report increase of dropping objects.  As well as when gripping with fitting sheet or laundry or cutting food increased pain as well as locking of fingers in bilateral hands with the left worse than the right.   FUNCTIONAL OUTCOME MEASURES: Reassess next session  UPPER EXTREMITY ROM:   Wrist active range of motion in forearm within normal limits pain-free  Active ROM Right eval Left eval R 02/08/24 R/L 02/24/24  L 02/29/24 L 03/02/24 PROM  Thumb MCP (0-60)        Thumb IP (0-80)        Thumb Radial abd/add (0-55)         Thumb Palmar abd/add (0-45)         Thumb Opposition to Small Finger         Index MCP (0-90) 80 locking in am  80  locking every time  R No locking last few days / L locking today    Index PIP (0-100) 100  90      Index DIP (0-70)          Long MCP (0-90)  85  80 locked this am       Long PIP (0-100)  100  90      Long DIP (0-70)          Ring MCP (0-90)  85  80 increase edema  90 PROM 90 90 90  Ring PIP (0-100)  100  90 90 PROM  90 95 intrinsic 100  Ring DIP (0-70)     70 PROM 70 70 70  Little MCP (0-90)  90  80      Little PIP (0-100)  100  90      Little DIP (0-70)          (Blank rows = not  tested) Patient with bilateral thumb CMC pain.  HAND FUNCTION: Grip strength: Right: 40 lbs; Left: 21 lbs, Lateral pinch: Right: 6 lbs, Left: 5 lbs, and 3 point pinch: Right: 5 lbs, Left: 6 lbs pain in the palm of bilateral hands left worse than the right 02/24/24: Grip strength: Right: 40 lbs; Left: 35 lbs, Lateral pinch: Right: 6 lbs, Left: 5 lbs, and 3 point pinch: Right: 7 lbs, Left: 6 lbs 03/02/24: Grip strength: Right: 40 lbs; Left: 35 lbs, Lateral pinch: Right: 7 lbs, Left: 6 lbs, and 3 point pinch: Right:  6 lbs, Left: 5 lbs  COORDINATION: Within functional limits have a hard time because of her thumbs with buttons and zippers and small objects  SENSATION: Patient wears a wrist brace at nighttime on the left hand report numbness at night if not wearing the brace  EDEMA: Left fourth digit  COGNITION: Overall cognitive status: Within functional limits for tasks assessed  TREATMENT DATE: 03/14/24    Patient arrived with reports of clicking in left fourth digit when performing passive range of motion. Was able to get her rings on fourth digit at times Upon review educated patient on not forcing endrange.  But stop before she hears the click or resistance. Patient was able to perform passive range of motion 98 MC, 95 at PIP and 65 for DIP. Importance of performing some moist heat or contrast in the morning prior to PROM to left fourth digit DIP/PIP flexion as well as composite flexion.  But stop before end range no triggering. Patient able to perform several composite flexion active range of motion touching palm this date with no triggering of the fourth. Bilateral second digits continued to trigger at times but no tenderness of the A1 pulley.  Tenderness decrease at 4th 3/10  No pain with passive range of motion or active range of motion of left fourth digit    Contrast Time: 8 with  Location: Left hand Decrease stiffness and pain prior to review of PROM to right second and fourth  digit       Manual Therapy:  Pt seen for manual tissue massage to mobilize tissue, decrease adhesions and to encourage lymphatic drainage by opening pathways. Use of Graston tool #6 for gentle brushing over volar palm and digits prior to range of motion to increase blood flow decrease pain and increased motion.  Patient can do ice massage over left 2nd and 4th A1 pulley several times during the day Continue contrast 2-3 times a day Isotoner glove for the left hand for nighttime. Silicone digit sleeve for daytime use on fourth digit.  Decreased pain at PIP as well as edema  Therapeutic Exercises:   Following manual therapy,  Passive range of motion pain-free DIP/PIP flexion pain-free  100 and 70 degrees  passive range of motion composite flexion pain-free REINFORCE   Able to touch palm actively several times during the day with no triggering   Iontophoresis: Iontophoresis with dexamethasone with small patch over left fourth A1 pulley 2.0 current - patient tolerated well  -patient to keep patch on for about an hour afterwards. Skin inspected prior to treatment with no issues.  Performed ionto only to left hand this date, right with no tenderness or issues.  Pt to continue with current HEP  PATIENT EDUCATION: Education details: findings of eval and HEP  Person educated: Patient Education method: Explanation, Demonstration, Tactile cues, Verbal cues, and Handouts Education comprehension: verbalized understanding, returned demonstration, verbal cues required, and needs further education     GOALS: Goals reviewed with patient? Yes  LONG TERM GOALS: Target date: 8 wks   Patient be independent home program of wearing splint as well as home exercises for modalities and modifications to decrease pain in the left hand less than 2/10. Baseline: Patient with pain 6/10 with tenderness over the A1 pulleys of the 2nd and 4th.  Increased edema at the fourth locking every attempt of flexion of the  second.  Morning locking on the 3rd and 4th. Goal status: Met  2.  Patient to verbalize decrease triggering of locking  of bilateral hands to less than 3 times a day. Baseline: Left hand triggering in the morning on the 3rd and 4th.  Second every attempt of flexion.  Right index finger locking in the morning. Goal status: Met  3.  Patient to verbalize 3-4 modifications or joint protection that she implemented to decrease triggering or locking of bilateral hands as well as decreasing pain Baseline: Patient no knowledge of modifications and joint protection. Goal status: Met  4.  Left hand digit flexion improved to within normal limits pain-free and symptom-free. Baseline: Left MC flexion 80 degrees and PIP is 90 degrees.  With tenderness and pain 6/10. Goal status: Progressing  5.  Grip strength improved with more than 5 pounds for patient to be able to use left hand with less triggering and brushing her teeth, using a knife, using her hands and bathing and dressing. Baseline:  Goal status: Progressing ASSESSMENT:  CLINICAL IMPRESSION: Patient seen for occupational therapy evaluation for bilateral hands with trigger fingers with the left worse than the right.  With a left index finger worse than the right index finger.  Increased edema in the 3rd and 4th digits with triggering at the the morning.  Patient pain 6/10 over the A1 pulleys at eval.  NOW patient pain at rest decreased to 0/10.  Patient shows some triggering at right second digit at times.  Patient report decreased pain with range of motion or tenderness at the fourth PIP on the left hand.  Tenderness decreased to a 3/10 at the left fourth A1 pulley.  Patient's passive range of motion and fourth digit within normal limits after reviewing of home exercises for PIP/DIP flexion followed by composite flexion.  Patient was able to perform composite active range of motion with no triggering on the left fourth.  Reinforced again with patient  performing passive range of motion DIP/PIP flexion and PROM composite flexion prior to attempts of active range of motion.  Has to perform it a few times during the day.  Continue to do contrast and ice massage to decrease discomfort A1 pulley.  Iontophoresis with dexamethasone for left fourth A1 pulley, no ionto to the right today. She is demonstrating improved functional use of bilateral hands especially on the right and has continued to engage in functional tasks at home.  She has continued to incorporate recommendations on modifying tools and equipment at home for homemaking tasks.  Patient to continue with home exercises for 3 weeks she is going to be out of town for a week and follow-up if was able to maintain progress.  Patient can benefit from skilled OT services to decrease pain, edema and triggering and increased motion and strength to return to prior level of function to avoid having the shots or surgery.  Patient is diabetic.  PERFORMANCE DEFICITS: in functional skills including ADLs, IADLs, ROM, strength, pain, flexibility, decreased knowledge of use of DME, and UE functional use,   and psychosocial skills including environmental adaptation and routines and behaviors.   IMPAIRMENTS: are limiting patient from ADLs, IADLs, rest and sleep, play, leisure, and social participation.   COMORBIDITIES: has no other co-morbidities that affects occupational performance. Patient will benefit from skilled OT to address above impairments and improve overall function.  MODIFICATION OR ASSISTANCE TO COMPLETE EVALUATION: No modification of tasks or assist necessary to complete an evaluation.  OT OCCUPATIONAL PROFILE AND HISTORY: Problem focused assessment: Including review of records relating to presenting problem.  CLINICAL DECISION MAKING: LOW - limited treatment options, no  task modification necessary  REHAB POTENTIAL: Good for goals  EVALUATION COMPLEXITY: Low   PLAN:  OT FREQUENCY:  1-2x/week  OT DURATION: 8 wks   PLANNED INTERVENTIONS: 97168 OT Re-evaluation, 97535 self care/ADL training, 02889 therapeutic exercise, 97530 therapeutic activity, 97112 neuromuscular re-education, 97140 manual therapy, 97035 ultrasound, 97018 paraffin, 02960 fluidotherapy, 97034 contrast bath, 97033 iontophoresis, 97760 Orthotic Initial, [id number] Orthotic/Prosthetic subsequent, patient/family education, and DME and/or AE instructions   CONSULTED AND AGREED WITH PLAN OF CARE: Patient   Ancel Peters, OTR/L,CLT 03/14/2024, 11:53 AM

## 2024-03-21 ENCOUNTER — Ambulatory Visit: Admitting: Occupational Therapy

## 2024-04-04 ENCOUNTER — Ambulatory Visit: Admitting: Occupational Therapy

## 2024-04-04 DIAGNOSIS — M65322 Trigger finger, left index finger: Secondary | ICD-10-CM

## 2024-04-04 DIAGNOSIS — M25642 Stiffness of left hand, not elsewhere classified: Secondary | ICD-10-CM

## 2024-04-04 DIAGNOSIS — M6281 Muscle weakness (generalized): Secondary | ICD-10-CM

## 2024-04-04 DIAGNOSIS — M65342 Trigger finger, left ring finger: Secondary | ICD-10-CM

## 2024-04-04 DIAGNOSIS — M65321 Trigger finger, right index finger: Secondary | ICD-10-CM

## 2024-04-04 NOTE — Therapy (Signed)
 OUTPATIENT OCCUPATIONAL THERAPY ORTHO TREATMENT  Patient Name: Kara Wood MRN: [id number] DOB:1945/12/30, 78 y.o., female Today's Date: 04/04/2024  PCP: Dr Auston REFERRING PROVIDER: Dr Kathlynn  END OF SESSION:  OT End of Session - 04/04/24 1317     Visit Number 15    Number of Visits 18    Date for OT Re-Evaluation 04/18/24    OT Start Time 1317    Activity Tolerance Patient tolerated treatment well    Behavior During Therapy [facility] for tasks assessed/performed            Past Medical History:  Diagnosis Date   Arthritis    Diabetes (HCC)    High cholesterol    Hypertension    Neuropathy in diabetes (HCC)    Stage 3 chronic kidney disease (HCC)    Past Surgical History:  Procedure Laterality Date   BACK SURGERY     CATARACT EXTRACTION W/PHACO Right 12/17/2014   Procedure: CATARACT EXTRACTION PHACO AND INTRAOCULAR LENS PLACEMENT (IOC);  Surgeon: Steven Dingeldein, MD;  Location: [facility] ORS;  Service: Ophthalmology;  Laterality: Right;  US  01:17 AP% 26.0 CDE 33.35   CHOLECYSTECTOMY     COLONOSCOPY WITH PROPOFOL  N/A 10/14/2023   Procedure: COLONOSCOPY WITH PROPOFOL ;  Surgeon: Onita Elspeth Sharper, DO;  Location: [facility] ENDOSCOPY;  Service: Gastroenterology;  Laterality: N/A;  DM   ESOPHAGOGASTRODUODENOSCOPY (EGD) WITH PROPOFOL  N/A 10/14/2023   Procedure: ESOPHAGOGASTRODUODENOSCOPY (EGD) WITH PROPOFOL ;  Surgeon: Onita Elspeth Sharper, DO;  Location: [facility] ENDOSCOPY;  Service: Gastroenterology;  Laterality: N/A;   POLYPECTOMY  10/14/2023   Procedure: POLYPECTOMY;  Surgeon: Onita Elspeth Sharper, DO;  Location: [facility] ENDOSCOPY;  Service: Gastroenterology;;   There are no active problems to display for this patient.   ONSET DATE: 6 months  REFERRING DIAG: Trigger fingers in bilateral hands   THERAPY DIAG:  Trigger index finger of left hand  Trigger finger, left ring finger  Trigger finger, right index finger  Stiffness of joints of both hands  Muscle weakness  (generalized)  Rationale for Evaluation and Treatment: Rehabilitation  SUBJECTIVE:   SUBJECTIVE STATEMENT: The right hand is doing well.  Not triggering.  The left hand the ring finger still hurts.  When I make a fist.  In the index finger and the ring finger has not been triggering for the last few days.   Pt accompanied by: self  PERTINENT HISTORY: 12/20/23 Dr Kathlynn Note - Assessment/Plan:   Assessment & Plan Trigger finger  She has bilateral index finger trigger finger, with the left worse than the right, and the left ring finger is also affected but cannot flex to lock. Hyperglycemia likely exacerbates the condition. There are concerns about complications from corticosteroid injections due to elevated A1c levels (8.2). Informed consent covered risks of injections, especially with diabetes, and the potential need for surgery if conservative measures fail. Refer to hand therapy for bilateral index and left ring finger trigger finger. Use Voltaren gel topically on affected fingers three to four times a day. Avoid oral NSAIDs due to potential kidney impact. Re-evaluate after a few weeks of hand therapy; consider corticosteroid injection if no improvement. Discuss potential need for surgery if conservative measures fail and blood sugar levels improve.  Type 2 diabetes mellitus with hyperglycemia  She has type 2 diabetes mellitus with a recent A1c of 8.2, indicating poor glycemic control. Current medications include metformin, pioglitazone (Actos), and Lantus insulin. Hyperglycemia is a concern for potential complications with trigger finger treatment. Encourage strict glycemic control to potentially improve  trigger finger symptoms and reduce risks associated with corticosteroid injections. Diagnoses and all orders for this visit:  Trigger index finger of left hand - Ambulatory Referral to Occupational Therapy  Trigger index finger of right hand - Ambulatory Referral to Occupational  Therapy  Trigger ring finger of left hand - Ambulatory Referral to Occupational Therapy   PRECAUTIONS: None    WEIGHT BEARING RESTRICTIONS: No  PAIN:  Are you having pain? 3-4/10 fourth digit with flexion and over A1 pulley  FALLS: Has patient fallen in last 6 months? No  LIVING ENVIRONMENT: Lives with: lives with their spouse   PLOF: Independent in basic ADLs and lighthouse activities. Patient mostly watching TV and on her phone.  And read a lot.  Do some laundry and cooking  PATIENT GOALS: Wants to get my trigger fingers better and avoid surgery.  NEXT MD VISIT:  4 wk after OT   OBJECTIVE:  Note: Objective measures were completed at Evaluation unless otherwise noted.  HAND DOMINANCE: Left  ADLs: Patient report increase of dropping objects.  As well as when gripping with fitting sheet or laundry or cutting food increased pain as well as locking of fingers in bilateral hands with the left worse than the right.   FUNCTIONAL OUTCOME MEASURES: Reassess next session  UPPER EXTREMITY ROM:   Wrist active range of motion in forearm within normal limits pain-free  Active ROM Right eval Left eval R 02/08/24 R/L 02/24/24  L 02/29/24 L 03/02/24 PROM L 04/04/24  Thumb MCP (0-60)         Thumb IP (0-80)         Thumb Radial abd/add (0-55)          Thumb Palmar abd/add (0-45)          Thumb Opposition to Small Finger          Index MCP (0-90) 80 locking in am  80  locking every time  R No locking last few days / L locking today     Index PIP (0-100) 100  90       Index DIP (0-70)           Long MCP (0-90)  85  80 locked this am        Long PIP (0-100)  100  90       Long DIP (0-70)           Ring MCP (0-90)  85  80 increase edema  90 PROM 90 90 90 85  Ring PIP (0-100)  100  90 90 PROM  90 95 intrinsic 100 100  Ring DIP (0-70)     70 PROM 70 70 70 70  Little MCP (0-90)  90  80       Little PIP (0-100)  100  90       Little DIP (0-70)           (Blank rows = not  tested) Patient with bilateral thumb CMC pain.  HAND FUNCTION: Grip strength: Right: 40 lbs; Left: 21 lbs, Lateral pinch: Right: 6 lbs, Left: 5 lbs, and 3 point pinch: Right: 5 lbs, Left: 6 lbs pain in the palm of bilateral hands left worse than the right 02/24/24: Grip strength: Right: 40 lbs; Left: 35 lbs, Lateral pinch: Right: 6 lbs, Left: 5 lbs, and 3 point pinch: Right: 7 lbs, Left: 6 lbs 03/02/24: Grip strength: Right: 40 lbs; Left: 35 lbs, Lateral pinch: Right: 7 lbs, Left: 6 lbs, and 3  point pinch: Right: 6 lbs, Left: 5 lbs 03/2624: Grip strength: Right: 40 lbs; Left: 40 lbs, Lateral pinch: Right: 7 lbs, Left: 6 lbs, and 3 point pinch: Right: 6 lbs, Left: 6 lbs  COORDINATION: Within functional limits have a hard time because of her thumbs with buttons and zippers and small objects  SENSATION: Patient wears a wrist brace at nighttime on the left hand report numbness at night if not wearing the brace  EDEMA: Left fourth digit  COGNITION: Overall cognitive status: Within functional limits for tasks assessed  TREATMENT DATE: 04/04/24    Patient presented with no pain and tenderness over the left A1 pulley of the fourth digit. No triggering with active range of motion outpatient hold for composite flexion Grip strength improved greatly.  See flowsheet Was able to get her rings on fourth digit at times And hold coffee cup.  Reinforced with patient again importance of performing moist heat or contrast in the morning prior to PROM to left fourth digit DIP/PIP flexion as well as composite flexion.  But stop before end range no triggering. Patient had no tenderness over bilateral second digit A1 pulleys. If patient flex individually index fingers appear to be triggering. But no triggering with composite fist of all 4 digits. Denies any pain in the last few days.   Reinforced with patient to stay away from prolonged static composite tight grip    PATIENT EDUCATION: Education details:  findings of eval and HEP  Person educated: Patient Education method: Explanation, Demonstration, Tactile cues, Verbal cues, and Handouts Education comprehension: verbalized understanding, returned demonstration, verbal cues required, and needs further education     GOALS: Goals reviewed with patient? Yes  LONG TERM GOALS: Target date: 8 wks   Patient be independent home program of wearing splint as well as home exercises for modalities and modifications to decrease pain in the left hand less than 2/10. Baseline: Patient with pain 6/10 with tenderness over the A1 pulleys of the 2nd and 4th.  Increased edema at the fourth locking every attempt of flexion of the second.  Morning locking on the 3rd and 4th. Goal status: Met  2.  Patient to verbalize decrease triggering of locking of bilateral hands to less than 3 times a day. Baseline: Left hand triggering in the morning on the 3rd and 4th.  Second every attempt of flexion.  Right index finger locking in the morning. Goal status: Met  3.  Patient to verbalize 3-4 modifications or joint protection that she implemented to decrease triggering or locking of bilateral hands as well as decreasing pain Baseline: Patient no knowledge of modifications and joint protection. Goal status: Met  4.  Left hand digit flexion improved to within normal limits pain-free and symptom-free. Baseline: Left MC flexion 80 degrees and PIP is 90 degrees.  With tenderness and pain 6/10. Goal status: Met  5.  Grip strength improved with more than 5 pounds for patient to be able to use left hand with less triggering and brushing her teeth, using a knife, using her hands and bathing and dressing. Baseline:  Grip R and L 40 lbs pain free today  Goal status: Met ASSESSMENT:  CLINICAL IMPRESSION: Patient seen for occupational therapy evaluation for bilateral hands with trigger fingers with the left worse than the right.  With a left index finger worse than the right  index finger.  Increased edema in the 3rd and 4th digits with triggering at the the morning.  Patient pain 6/10 over the A1  pulleys at eval.  NOW patient presented after doing home exercises for 3 weeks.  No tenderness over the A1 pulleys.  No triggering with composite flexion as well as placing hold.  Patient able to get her jewelry on as well as hold a coffee mug.  Patient grip strength on the left dominant hand 40 pounds right 40 pounds.  Patient denies pain.  Still sleeping with Isotoner gloves.  Also wearing MC block splint less than 15% of the time.  Patient met all goals.  And agree for discharge.    Patient is diabetic.  PERFORMANCE DEFICITS: in functional skills including ADLs, IADLs, ROM, strength, pain, flexibility, decreased knowledge of use of DME, and UE functional use,   and psychosocial skills including environmental adaptation and routines and behaviors.   IMPAIRMENTS: are limiting patient from ADLs, IADLs, rest and sleep, play, leisure, and social participation.   COMORBIDITIES: has no other co-morbidities that affects occupational performance. Patient will benefit from skilled OT to address above impairments and improve overall function.  MODIFICATION OR ASSISTANCE TO COMPLETE EVALUATION: No modification of tasks or assist necessary to complete an evaluation.  OT OCCUPATIONAL PROFILE AND HISTORY: Problem focused assessment: Including review of records relating to presenting problem.  CLINICAL DECISION MAKING: LOW - limited treatment options, no task modification necessary  REHAB POTENTIAL: Good for goals  EVALUATION COMPLEXITY: Low   PLAN:  OT FREQUENCY: 1-2x/week  OT DURATION: 8 wks   PLANNED INTERVENTIONS: 97168 OT Re-evaluation, 97535 self care/ADL training, 02889 therapeutic exercise, 97530 therapeutic activity, 97112 neuromuscular re-education, 97140 manual therapy, 97035 ultrasound, 97018 paraffin, 02960 fluidotherapy, 97034 contrast bath, 97033 iontophoresis, 97760  Orthotic Initial, [id number] Orthotic/Prosthetic subsequent, patient/family education, and DME and/or AE instructions   CONSULTED AND AGREED WITH PLAN OF CARE: Patient   Ancel Peters, OTR/L,CLT 04/04/2024, 1:19 PM

## 2024-04-07 ENCOUNTER — Ambulatory Visit: Admitting: Occupational Therapy
